# Patient Record
Sex: Male | Born: 1955 | Race: White | Hispanic: No | Marital: Married | Smoking: Former smoker
Health system: Southern US, Academic
[De-identification: ages and names within clinical notes are randomized; demographics above are authoritative.]

## PROBLEM LIST (undated history)

## (undated) DIAGNOSIS — G600 Hereditary motor and sensory neuropathy: Secondary | ICD-10-CM

## (undated) DIAGNOSIS — R208 Other disturbances of skin sensation: Secondary | ICD-10-CM

## (undated) DIAGNOSIS — M2550 Pain in unspecified joint: Secondary | ICD-10-CM

## (undated) DIAGNOSIS — N200 Calculus of kidney: Secondary | ICD-10-CM

## (undated) DIAGNOSIS — N5202 Corporo-venous occlusive erectile dysfunction: Secondary | ICD-10-CM

## (undated) DIAGNOSIS — M545 Low back pain, unspecified: Secondary | ICD-10-CM

## (undated) DIAGNOSIS — N401 Enlarged prostate with lower urinary tract symptoms: Secondary | ICD-10-CM

## (undated) DIAGNOSIS — Z9989 Dependence on other enabling machines and devices: Secondary | ICD-10-CM

## (undated) DIAGNOSIS — G4733 Obstructive sleep apnea (adult) (pediatric): Secondary | ICD-10-CM

## (undated) DIAGNOSIS — G8929 Other chronic pain: Secondary | ICD-10-CM

## (undated) DIAGNOSIS — G890 Central pain syndrome: Secondary | ICD-10-CM

## (undated) DIAGNOSIS — N319 Neuromuscular dysfunction of bladder, unspecified: Secondary | ICD-10-CM

## (undated) DIAGNOSIS — N529 Male erectile dysfunction, unspecified: Secondary | ICD-10-CM

## (undated) DIAGNOSIS — R739 Hyperglycemia, unspecified: Secondary | ICD-10-CM

## (undated) HISTORY — DX: Benign prostatic hyperplasia with lower urinary tract symptoms: N40.1

## (undated) HISTORY — DX: Obstructive sleep apnea (adult) (pediatric): G47.33

## (undated) HISTORY — DX: Central pain syndrome: G89.0

## (undated) HISTORY — DX: Low back pain, unspecified: M54.50

## (undated) HISTORY — DX: Other disturbances of skin sensation: R20.8

## (undated) HISTORY — DX: Other chronic pain: G89.29

## (undated) HISTORY — DX: Dependence on other enabling machines and devices: Z99.89

## (undated) HISTORY — DX: Calculus of kidney: N20.0

## (undated) HISTORY — DX: Corporo-venous occlusive erectile dysfunction: N52.02

## (undated) HISTORY — DX: Male erectile dysfunction, unspecified: N52.9

## (undated) HISTORY — DX: Hereditary motor and sensory neuropathy: G60.0

## (undated) HISTORY — DX: Pain in unspecified joint: M25.50

## (undated) HISTORY — DX: Hyperglycemia, unspecified: R73.9

## (undated) HISTORY — PX: EXCISIONAL HEMORRHOIDECTOMY: SHX1541

## (undated) HISTORY — PX: BLADDER STONE REMOVAL: SHX568

## (undated) HISTORY — DX: Neuromuscular dysfunction of bladder, unspecified: N31.9

---

## 1992-11-08 ENCOUNTER — Emergency Department (HOSPITAL_COMMUNITY): Payer: Self-pay

## 2020-05-14 IMAGING — MR MRI BRAIN WITHOUT CONTRAST
8 of 9 series · 33 of 48 positions shown · IV contrast (gadolinium)
Comparison: Brain MRI dated 10/06/2013.

﻿EXAM:  MRI BRAIN WITHOUT CONTRAST
INDICATION: History of multiple sclerosis, followup exam.
TECHNIQUE: Multiplanar, multisequential MRI of the brain was performed without gadolinium contrast.

[Series 5: DWI · axial · 5.0mm · 1.35mm/px · z∈[-53,+73]mm · 8 of 88 slices shown (1 of 3)]
[im 1/88]
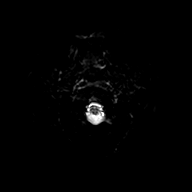
[im 14/88]
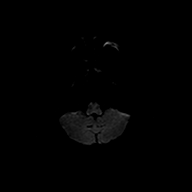
[im 27/88]
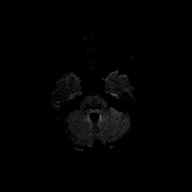
[im 41/88]
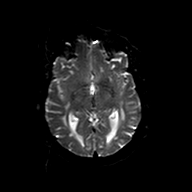
[im 47/88]
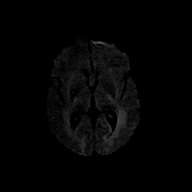
[im 61/88]
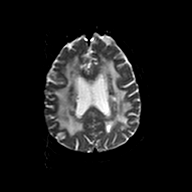
[im 74/88]
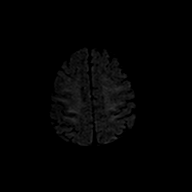
[im 88/88]
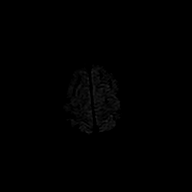

[Series 6: DWI · axial · 5.0mm · 1.35mm/px · z∈[-53,+73]mm · 3 of 22 slices shown (2 of 3)]
[im 1/22]
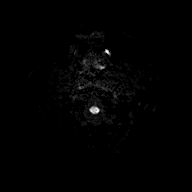
[im 11/22]
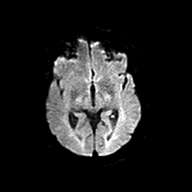
[im 22/22]
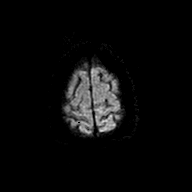

[Series 7: DWI · axial · 5.0mm · 1.35mm/px · z∈[-53,+73]mm · 3 of 22 slices shown (3 of 3)]
[im 1/22]
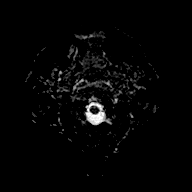
[im 11/22]
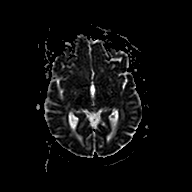
[im 22/22]
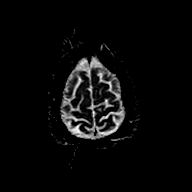

[Series 8: FLAIR · sagittal · 5.0mm · 0.75mm/px · 4 of 28 slices shown (1 of 2)]
[im 1/28]
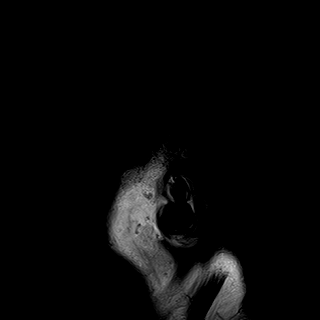
[im 10/28]
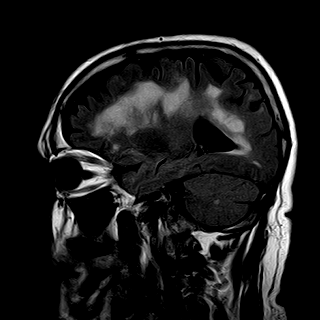
[im 19/28]
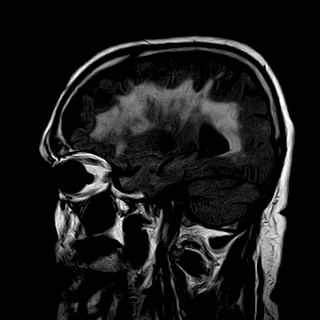
[im 28/28]
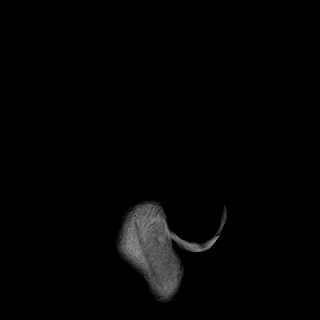

[Series 9: FLAIR · axial · 4.0mm · 0.43mm/px · z∈[-57,+97]mm · 5 of 36 slices shown (2 of 2)]
[im 1/36]
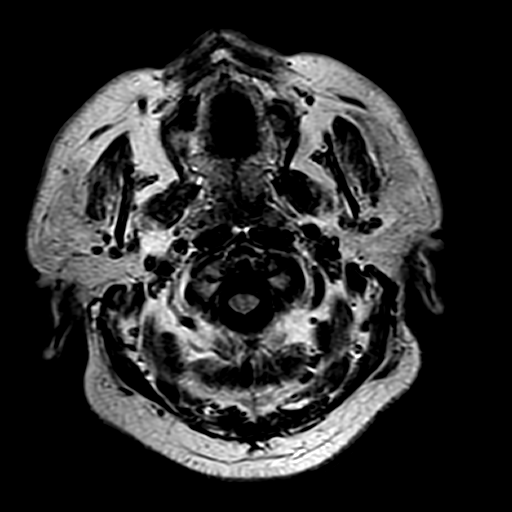
[im 9/36]
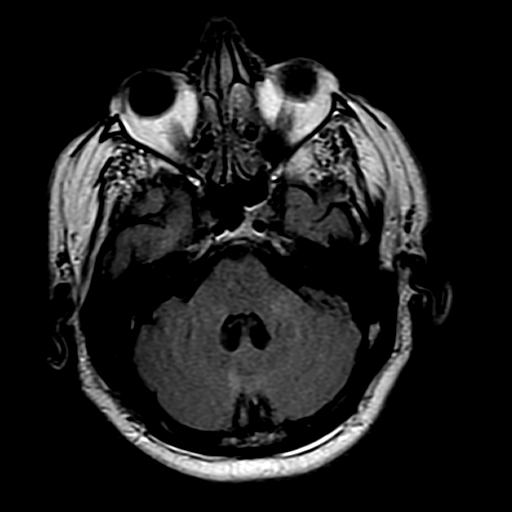
[im 18/36]
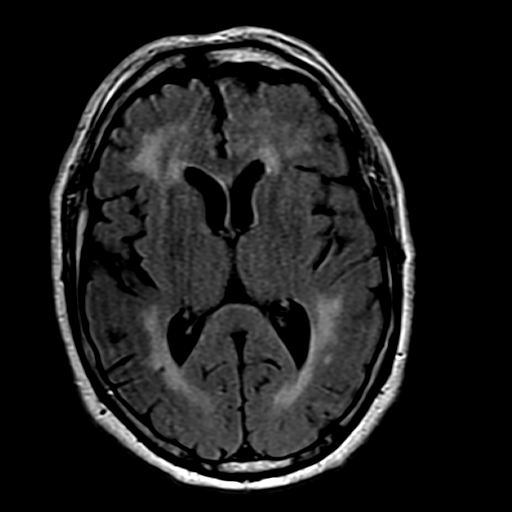
[im 27/36]
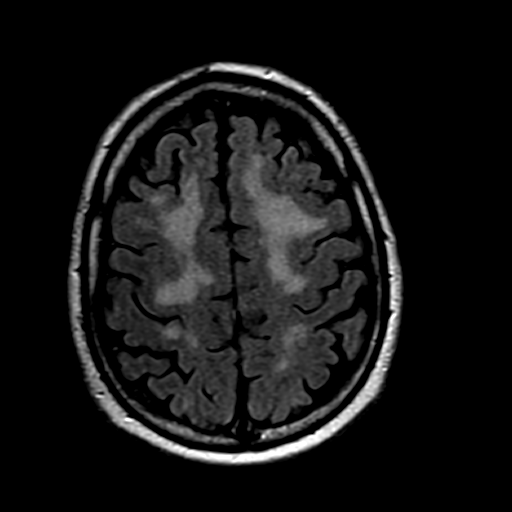
[im 36/36]
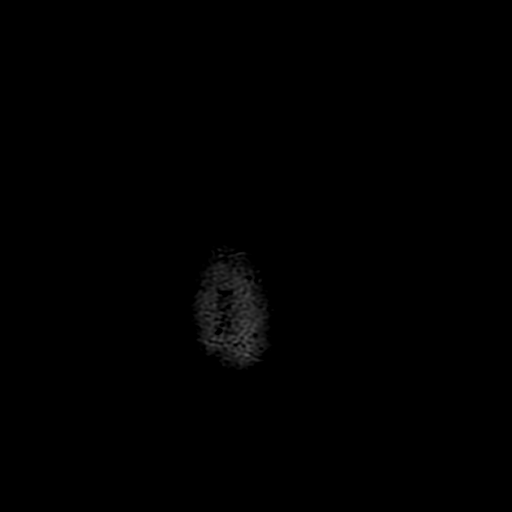

[Series 11: PD · axial · 4.0mm · 0.43mm/px · 1 of 36 slices shown]
[im 1/36]
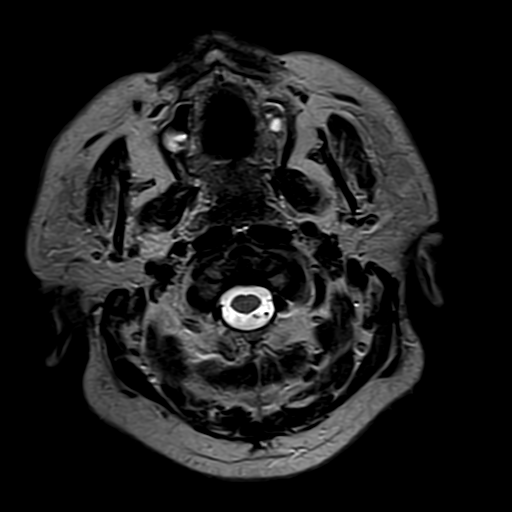

[Series 13: T2 · coronal · 6.0mm · 0.43mm/px · 4 of 28 slices shown]
[im 1/28]
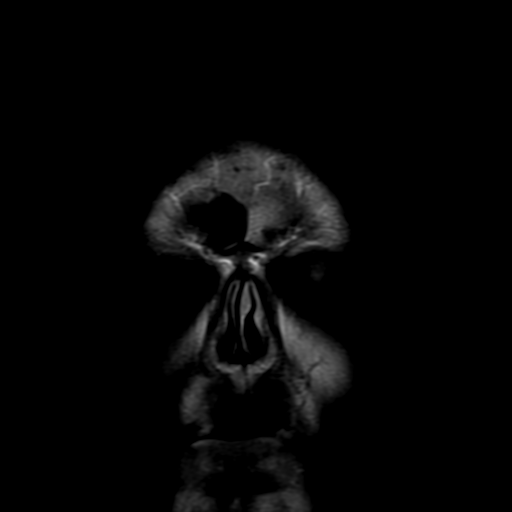
[im 10/28]
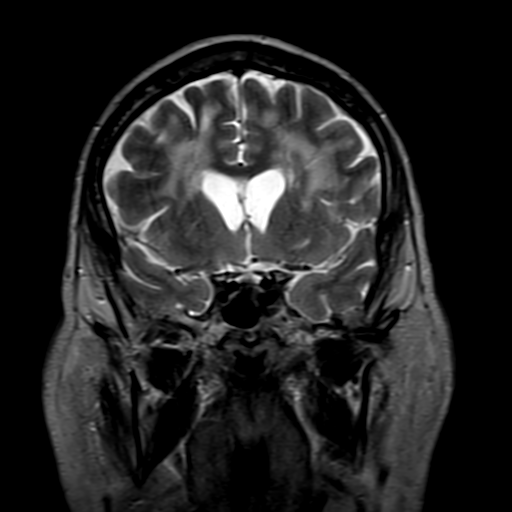
[im 19/28]
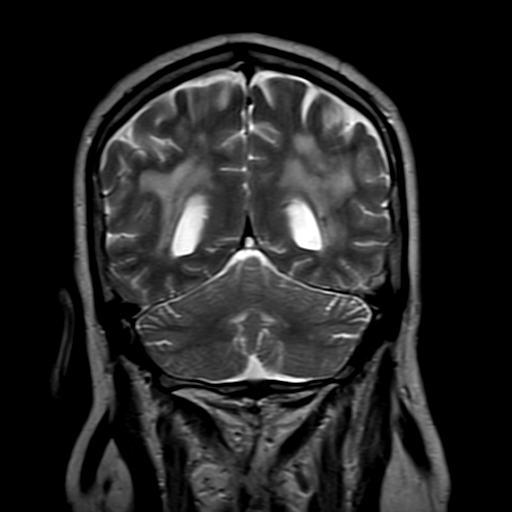
[im 28/28]
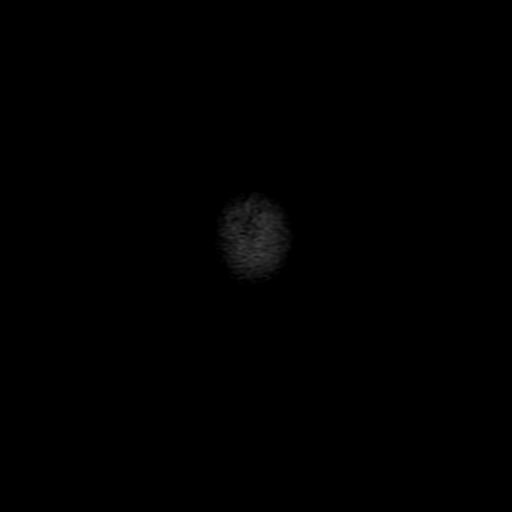

[Series 14: T1 · axial · 4.0mm · 0.43mm/px · z∈[-57,+97]mm · 5 of 36 slices shown]
[im 1/36]
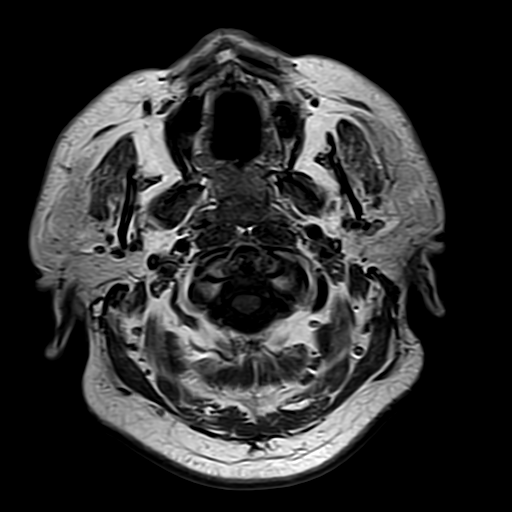
[im 9/36]
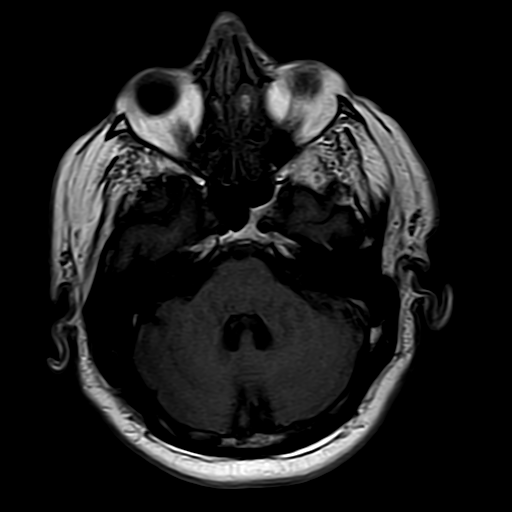
[im 18/36]
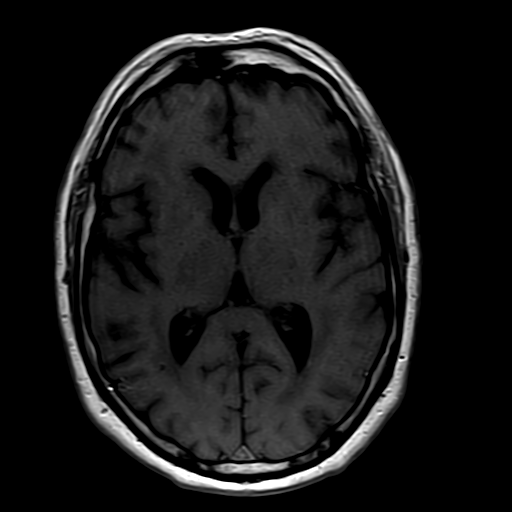
[im 27/36]
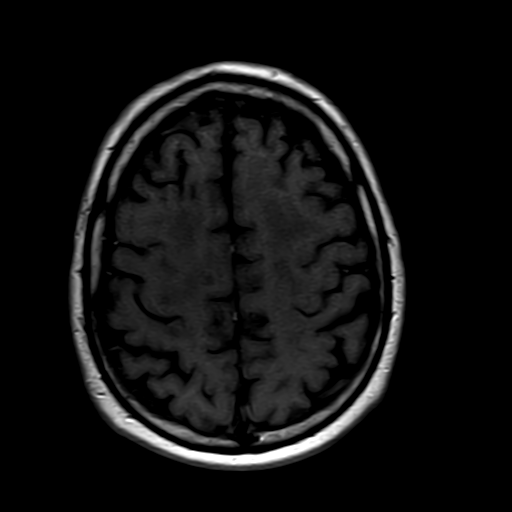
[im 36/36]
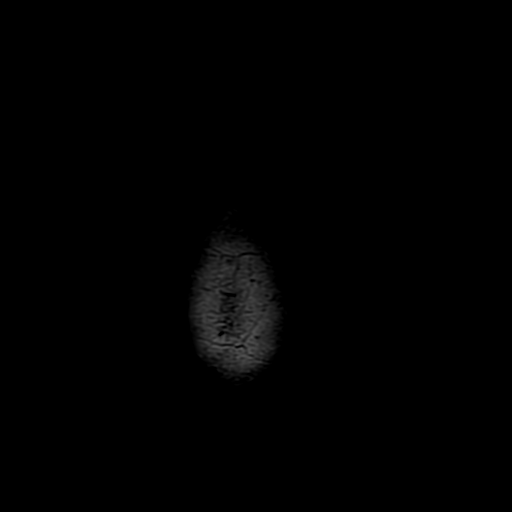

[33 of 48 positions shown; findings below may reference images not displayed]

FINDINGS: Ventricular and sulcal size is normal for the patient's age.  Extensive predominantly confluent foci of abnormal T2/FLAIR signal hyperintensity within both cerebral hemispheres are again identified.  Similar lesions within the cerebellar vermis and left cerebellar hemisphere are also again identified.  No new lesions are noted.  There is no mass effect, midline shift, intracranial hemorrhage, acute infarction or prior microhemorrhages. Skull base flow voids and basal cisterns are patent. Sagittal survey of midline structures is unremarkable. There are no extra-axial fluid collections. Visualized paranasal sinuses cells and orbital contents are unremarkable.  There is extensive opacification of right mastoid air cells.
IMPRESSION: Stable disease.  No acute intracranial abnormality.

## 2021-04-08 DIAGNOSIS — E079 Disorder of thyroid, unspecified: Secondary | ICD-10-CM | POA: Insufficient documentation

## 2021-09-05 ENCOUNTER — Ambulatory Visit (INDEPENDENT_AMBULATORY_CARE_PROVIDER_SITE_OTHER): Payer: Medicare Other

## 2021-09-05 ENCOUNTER — Other Ambulatory Visit: Payer: Medicare Other | Attending: Family Medicine | Admitting: Family Medicine

## 2021-09-05 ENCOUNTER — Other Ambulatory Visit: Payer: Self-pay

## 2021-09-05 DIAGNOSIS — E785 Hyperlipidemia, unspecified: Secondary | ICD-10-CM

## 2021-09-05 DIAGNOSIS — E782 Mixed hyperlipidemia: Secondary | ICD-10-CM | POA: Insufficient documentation

## 2021-09-05 DIAGNOSIS — I10 Essential (primary) hypertension: Secondary | ICD-10-CM

## 2021-09-05 LAB — HEPATIC FUNCTION PANEL
ALBUMIN/GLOBULIN RATIO: 1.3 (ref 0.8–1.4)
ALBUMIN: 4 g/dL (ref 3.5–5.7)
ALKALINE PHOSPHATASE: 40 U/L (ref 34–104)
ALT (SGPT): 16 U/L (ref 7–52)
AST (SGOT): 16 U/L (ref 13–39)
BILIRUBIN DIRECT: 0.04 md/dL (ref <=0.20)
BILIRUBIN TOTAL: 0.4 mg/dL (ref 0.3–1.2)
BILIRUBIN, INDIRECT: 0.36 mg/dL (ref <=1)
GLOBULIN: 3.1 (ref 2.9–5.4)
PROTEIN TOTAL: 7.1 g/dL (ref 6.4–8.9)

## 2021-09-05 LAB — HGA1C (HEMOGLOBIN A1C WITH EST AVG GLUCOSE): HEMOGLOBIN A1C: 5.8 % (ref 4.0–6.0)

## 2021-09-05 LAB — BASIC METABOLIC PANEL
ANION GAP: 7 mmol/L — ABNORMAL LOW (ref 10–20)
BUN/CREA RATIO: 26 — ABNORMAL HIGH (ref 6–22)
BUN: 28 mg/dL — ABNORMAL HIGH (ref 7–25)
CALCIUM: 9.2 mg/dL (ref 8.6–10.3)
CHLORIDE: 110 mmol/L — ABNORMAL HIGH (ref 98–107)
CO2 TOTAL: 23 mmol/L (ref 21–31)
CREATININE: 1.07 mg/dL (ref 0.60–1.30)
ESTIMATED GFR: 76 mL/min/{1.73_m2} (ref >59)
GLUCOSE: 117 mg/dL — ABNORMAL HIGH (ref 74–109)
OSMOLALITY, CALCULATED: 286 mOsm/kg (ref 270–290)
POTASSIUM: 4 mmol/L (ref 3.5–5.1)
SODIUM: 140 mmol/L (ref 136–145)

## 2021-09-05 LAB — CBC
HCT: 42.7 % (ref 42.0–51.0)
HGB: 14.2 g/dL (ref 13.5–18.0)
MCH: 27.6 pg (ref 27.0–32.0)
MCHC: 33.4 g/dL (ref 32.0–36.0)
MCV: 82.8 fL (ref 78.0–99.0)
MPV: 9.5 fL (ref 7.4–10.4)
PLATELETS: 168 10*3/uL (ref 140–440)
RBC: 5.15 10*6/uL (ref 4.20–6.00)
RDW: 15.3 % — ABNORMAL HIGH (ref 11.6–14.8)
WBC: 5.7 10*3/uL (ref 4.0–10.5)
WBCS UNCORRECTED: 5.7 10*3/uL

## 2021-09-05 LAB — LIPID PANEL
CHOL/HDL RATIO: 8.3
CHOLESTEROL: 150 mg/dL (ref <200)
HDL CHOL: 18 mg/dL — ABNORMAL LOW (ref 23–92)
LDL CALC: 80 mg/dL (ref 0–100)
TRIGLYCERIDES: 259 mg/dL — ABNORMAL HIGH (ref <=150)
VLDL CALC: 52 mg/dL — ABNORMAL HIGH (ref 0–50)

## 2021-09-05 LAB — THYROID STIMULATING HORMONE (SENSITIVE TSH): TSH: 3.374 u[IU]/mL (ref 0.450–5.330)

## 2021-09-09 ENCOUNTER — Encounter (INDEPENDENT_AMBULATORY_CARE_PROVIDER_SITE_OTHER): Payer: Self-pay | Admitting: Family Medicine

## 2021-09-09 DIAGNOSIS — G35 Multiple sclerosis: Secondary | ICD-10-CM | POA: Insufficient documentation

## 2021-09-09 DIAGNOSIS — G47419 Narcolepsy without cataplexy: Secondary | ICD-10-CM | POA: Insufficient documentation

## 2021-09-09 DIAGNOSIS — I10 Essential (primary) hypertension: Secondary | ICD-10-CM | POA: Insufficient documentation

## 2021-09-09 DIAGNOSIS — E782 Mixed hyperlipidemia: Secondary | ICD-10-CM

## 2021-09-09 DIAGNOSIS — N400 Enlarged prostate without lower urinary tract symptoms: Secondary | ICD-10-CM | POA: Insufficient documentation

## 2021-09-09 DIAGNOSIS — E559 Vitamin D deficiency, unspecified: Secondary | ICD-10-CM | POA: Insufficient documentation

## 2021-09-14 ENCOUNTER — Ambulatory Visit (INDEPENDENT_AMBULATORY_CARE_PROVIDER_SITE_OTHER): Payer: Medicare Other | Admitting: Family Medicine

## 2021-09-14 ENCOUNTER — Encounter (INDEPENDENT_AMBULATORY_CARE_PROVIDER_SITE_OTHER): Payer: Self-pay | Admitting: Family Medicine

## 2021-09-14 ENCOUNTER — Other Ambulatory Visit: Payer: Self-pay

## 2021-09-14 VITALS — BP 151/86 | HR 70 | Temp 98.9°F | Ht 69.0 in | Wt 237.0 lb

## 2021-09-14 DIAGNOSIS — R351 Nocturia: Secondary | ICD-10-CM

## 2021-09-14 DIAGNOSIS — G500 Trigeminal neuralgia: Secondary | ICD-10-CM | POA: Insufficient documentation

## 2021-09-14 DIAGNOSIS — E782 Mixed hyperlipidemia: Secondary | ICD-10-CM

## 2021-09-14 DIAGNOSIS — G35 Multiple sclerosis: Secondary | ICD-10-CM

## 2021-09-14 DIAGNOSIS — Z9989 Dependence on other enabling machines and devices: Secondary | ICD-10-CM

## 2021-09-14 DIAGNOSIS — N401 Enlarged prostate with lower urinary tract symptoms: Secondary | ICD-10-CM

## 2021-09-14 DIAGNOSIS — E559 Vitamin D deficiency, unspecified: Secondary | ICD-10-CM

## 2021-09-14 DIAGNOSIS — G4733 Obstructive sleep apnea (adult) (pediatric): Secondary | ICD-10-CM

## 2021-09-14 DIAGNOSIS — M5386 Other specified dorsopathies, lumbar region: Secondary | ICD-10-CM

## 2021-09-14 DIAGNOSIS — I10 Essential (primary) hypertension: Secondary | ICD-10-CM

## 2021-09-14 DIAGNOSIS — G47419 Narcolepsy without cataplexy: Secondary | ICD-10-CM

## 2021-09-14 MED ORDER — METHYLPREDNISOLONE ACETATE 40 MG/ML SUSPENSION FOR INJECTION
40.0000 mg | INTRAMUSCULAR | Status: AC
Start: 2021-09-14 — End: 2021-09-14
  Administered 2021-09-14: 40 mg via INTRAMUSCULAR

## 2021-09-14 MED ORDER — LEVOTHYROXINE 25 MCG TABLET
25.0000 ug | ORAL_TABLET | Freq: Every day | ORAL | 1 refills | Status: DC
Start: 2021-09-14 — End: 2022-04-24

## 2021-09-14 MED ORDER — FENOFIBRATE MICRONIZED 200 MG CAPSULE
200.0000 mg | ORAL_CAPSULE | Freq: Every day | ORAL | 1 refills | Status: DC
Start: 2021-09-14 — End: 2022-05-11

## 2021-09-14 MED ORDER — DEXAMETHASONE SODIUM PHOSPHATE 4 MG/ML INJECTION SOLUTION
4.0000 mg | INTRAMUSCULAR | Status: AC
Start: 2021-09-14 — End: 2021-09-14
  Administered 2021-09-14: 4 mg via INTRAMUSCULAR

## 2021-09-14 MED ORDER — METHYLPREDNISOLONE 4 MG TABLETS IN A DOSE PACK
ORAL_TABLET | ORAL | 1 refills | Status: DC
Start: 2021-09-14 — End: 2021-12-12

## 2021-09-14 MED ORDER — DICLOFENAC 1 % TOPICAL GEL
2.00 g | Freq: Four times a day (QID) | CUTANEOUS | 1 refills | Status: AC
Start: 2021-09-14 — End: ?

## 2021-09-14 MED ORDER — TAMSULOSIN 0.4 MG CAPSULE
0.4000 mg | ORAL_CAPSULE | Freq: Every day | ORAL | 1 refills | Status: DC
Start: 2021-09-14 — End: 2022-04-24

## 2021-09-14 NOTE — Nursing Note (Signed)
09/14/21 5790   Depression Screen   Little interest or pleasure in doing things. 0   Feeling down, depressed, or hopeless 0   PHQ 2 Total 0

## 2021-09-14 NOTE — Addendum Note (Signed)
Addended by: Leory Plowman on: 09/14/2021 05:18 PM     Modules accepted: Orders

## 2021-09-14 NOTE — Progress Notes (Signed)
FAMILY MEDICINE, MEDICAL OFFICE BUILDING  [address]  [place] [address]       Name: Carlos Friedman MRN:  [id number]   Date: 09/14/2021 Age: 66 y.o.          Provider: Mickey Farber, DO    Reason for visit: Follow Up 6 Months      History of Present Illness:  09/14/2021:  This 66 year old male returns for six-month follow-up reviewed his labs and get refills on all his medications.  He has lost 6 lb since her last he sees Dr. Farris Has who was late with his multiple sclerosis medications for 3 weeks and is unsure whether the back pain started because he was out of his medications or not.  He has a shooting stabbing tingling pain goes down the back of both legs with mostly on the right side.  He states we have treated this in the past with steroids that helped.  His CBC was normal with a hemoglobin of 14.2 hematocrit 42.7 electrolytes revealed BUN is elevated 2800 make sure he stays well hydrated at all times.  His sodium was 140 chloride 110 carbon dioxide 23 glucose was 117 creatinine 1.07 GFR 76 calcium 9.2 cholesterol was 150 HDL low 18 LDL 80 triglycerides 259 high.  TSH was 3.374 liver enzymes are normal hemoglobin A1c was 5.8.  The patient needs a new mask he continues to use and benefit from CPAP on a regular basis.  Historical Data    Past Medical History:  Past Medical History:   Diagnosis Date   . Arthralgia    . Benign prostatic hyperplasia with lower urinary tract symptoms    . Calculus of kidney    . Chronic bilateral low back pain without sciatica    . Corporo-venous occlusive erectile dysfunction    . Dejerine Roussy syndrome    . Hyperalgesia    . Hyperglycemia    . Kidney stones    . Male erectile dysfunction, unspecified    . Neuromuscular dysfunction of bladder, unspecified    . OSA on CPAP    . Roussy-Levy syndrome          Past Surgical History:        Allergies:  Allergies   Allergen Reactions   . Penicillin Nausea/ Vomiting     Medications:  Current Outpatient Medications    Medication Sig   . cholecalciferol, vitamin D3, 50 mcg (2,000 unit) Oral Tablet Take 1 Tablet (2,000 Units total) by mouth Once a day   . diclofenac sodium (VOLTAREN) 1 % Gel Apply 2 g topically Four times a day   . fenofibrate micronized (LOFIBRA) 200 mg Oral Capsule Take 1 Capsule (200 mg total) by mouth Once a day   . glatiramer 20 mg/mL Subcutaneous Syringe Inject 1 mL (20 mg total) under the skin Once a day   . levothyroxine (SYNTHROID) 25 mcg Oral Tablet Take 1 Tablet (25 mcg total) by mouth Once a day   . Methylprednisolone (MEDROL, PAK,) 4 mg Oral Tablets, Dose Pack Take as instructed.   . tamsulosin (FLOMAX) 0.4 mg Oral Capsule Take 1 Capsule (0.4 mg total) by mouth Once a day     Family History:  Family Medical History:     Problem Relation (Age of Onset)    COPD Other    Hypertension (High Blood Pressure) Other          Social History:  Social History     Socioeconomic History   . Marital status:  Married   Tobacco Use   . Smoking status: Former     Packs/day: 1.00     Years: 20.00     Pack years: 20.00     Types: Cigarettes     Quit date: 2005     Years since quitting: 18.5   . Smokeless tobacco: Never   Vaping Use   . Vaping Use: Never used   Substance and Sexual Activity   . Alcohol use: Yes     Comment: socially   . Drug use: Never           Review of Systems:  Any pertinent Review of Systems as addressed in the HPI above.    Physical Exam:  Vital Signs:  Vitals:    09/14/21 0929   BP: (!) 151/86   Pulse: 70   Temp: 37.2 C (98.9 F)   TempSrc: Temporal   SpO2: 94%   Weight: 108 kg (237 lb)   Height: 1.753 m (5\' 9" )   BMI: 35.07   HC: 18 cm (7.09")     Physical Exam  Constitutional:       Appearance: Normal appearance. He is obese.      Comments: BMI is elevated at 35.00 kilograms/meter squared which is class 2 obesity.  His heart.   HENT:      Head: Normocephalic and atraumatic.      Right Ear: Tympanic membrane, ear canal and external ear normal.      Left Ear: Tympanic membrane, ear canal and  external ear normal.      Nose: Nose normal.      Mouth/Throat:      Mouth: Mucous membranes are moist.      Pharynx: Oropharynx is clear.   Eyes:      Extraocular Movements: Extraocular movements intact.      Conjunctiva/sclera: Conjunctivae normal.      Pupils: Pupils are equal, round, and reactive to light.   Cardiovascular:      Rate and Rhythm: Normal rate and regular rhythm.      Pulses: Normal pulses.      Heart sounds: Normal heart sounds.   Pulmonary:      Effort: Pulmonary effort is normal.      Breath sounds: Normal breath sounds.   Abdominal:      General: Abdomen is flat.      Palpations: Abdomen is soft.   Musculoskeletal:      Cervical back: Normal range of motion and neck supple.      Lumbar back: Spasms and tenderness present. Decreased range of motion.      Comments: Patient was able to flex to touch his toes with complete reversal of the lumbar curve.  He had a negative dural stretch sign and normal reflexes.   Skin:     General: Skin is warm and dry.      Capillary Refill: Capillary refill takes less than 2 seconds.   Neurological:      General: No focal deficit present.      Mental Status: He is alert and oriented to person, place, and time. Mental status is at baseline.   Psychiatric:         Mood and Affect: Mood normal.         Behavior: Behavior normal.       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 Multiple sclerosis (CMS HCC)  G35  3. Vitamin D deficiency  E55.9       4. Essential (primary) hypertension  I10 LIPID PANEL     HEPATIC FUNCTION PANEL     CBC/DIFF     URINALYSIS, MACROSCOPIC AND MICROSCOPIC W/CULTURE REFLEX     BASIC METABOLIC PANEL     THYROID STIMULATING HORMONE (SENSITIVE TSH)      5. Mixed hyperlipidemia  E78.2       6. Benign prostatic hyperplasia with nocturia  N40.1     R35.1       7. Sciatica associated with disorder of lumbar spine  M53.86       8. OSA on CPAP  G47.33     Z99.89          Plan:  Orders Placed This  Encounter   . LIPID PANEL   . HEPATIC FUNCTION PANEL   . CBC/DIFF   . URINALYSIS, MACROSCOPIC AND MICROSCOPIC W/CULTURE REFLEX   . BASIC METABOLIC PANEL   . THYROID STIMULATING HORMONE (SENSITIVE TSH)   . diclofenac sodium (VOLTAREN) 1 % Gel   . fenofibrate micronized (LOFIBRA) 200 mg Oral Capsule   . levothyroxine (SYNTHROID) 25 mcg Oral Tablet   . tamsulosin (FLOMAX) 0.4 mg Oral Capsule   . Methylprednisolone (MEDROL, PAK,) 4 mg Oral Tablets, Dose Pack   . methylPREDNISolone acetate (DEPO-MEDROL) 40 mg/mL injection   . dexamethasone 4 mg/mL injection     The patient continues to use and benefit from CPAP.  Today I refilled his diclofenac gel fenofibrate levothyroxine tamsulosin methylprednisolone Medrol Dosepak was given I will also give him 4 mg of Decadron and 40 mg of Depo-Medrol.  Today I ordered a lipid panel hepatic function panel CBC with diff urinalysis basic metabolic panel thyroid-stimulating hormone to be done in 6 months.  More than 50% of visit spent counseling and coordinating patient care.  All questions were answered to his satisfaction of the patient.  I continue to recommend he stay on a heart healthy low-fat low-cholesterol diet and avoid high fructose corn syrup.  When we have a new neurologist here he will consider switching neurologist.  His blood pressure was elevated today and I suggested he limit salt to no more than 2 g a day.  Also recommended check his blood pressure frequently at home if it stays above 120/80 he may need to be on medications to control his blood pressure.  His narcolepsy with cataplexy is stable his multiple sclerosis is stable.  I also put an order for an x-ray of his lumbar spine.    Return in about 6 months (around 03/17/2022).    Mickey Farber, DO     Portions of this note may be dictated using voice recognition software or a dictation service. Variances in spelling and vocabulary are possible and unintentional. Not all errors are caught/corrected. Please notify the  [facility] if any discrepancies are noted or if the meaning of any statement is not clear.

## 2021-10-07 IMAGING — MR MRI BRAIN W/O CONTRAST
8 of 10 series · 32 of 48 positions shown · IV contrast (gadolinium)
Comparison: Previous examinations dated 05/14/2020, 10/06/2013.

﻿EXAM:  MRI BRAIN W/O CONTRAST
INDICATION: Multiple sclerosis, follow-up.
TECHNIQUE: Multiplanar, multisequential MRI of the brain was performed without gadolinium contrast.  Axial and sagittal FLAIR sequences as per [facility] protocol.

[Series 5: DWI · axial · 5.0mm · 1.35mm/px · z∈[-48,+78]mm · 8 of 88 slices shown (1 of 3)]
[im 1/88]
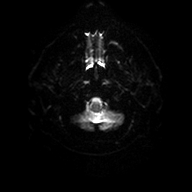
[im 10/88]
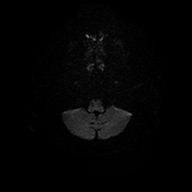
[im 30/88]
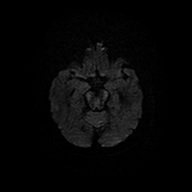
[im 39/88]
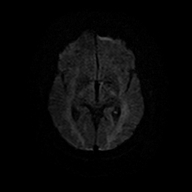
[im 49/88]
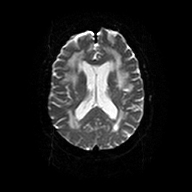
[im 59/88]
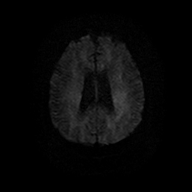
[im 78/88]
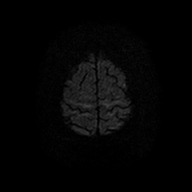
[im 88/88]
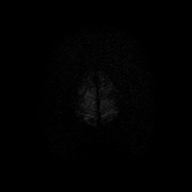

[Series 6: DWI · axial · 5.0mm · 1.35mm/px · z∈[-48,+78]mm · 2 of 22 slices shown (2 of 3)]
[im 1/22]
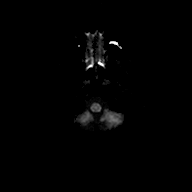
[im 22/22]
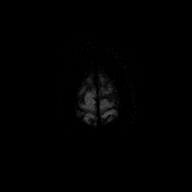

[Series 7: DWI · axial · 5.0mm · 1.35mm/px · z∈[-48,+78]mm · 3 of 22 slices shown (3 of 3)]
[im 1/22]
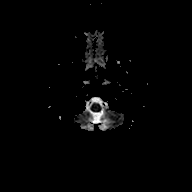
[im 11/22]
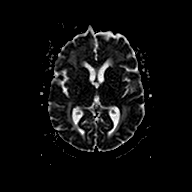
[im 22/22]
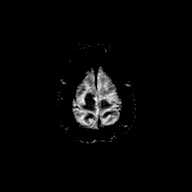

[Series 8: FLAIR · sagittal · 5.0mm · 0.75mm/px · 4 of 28 slices shown (1 of 2)]
[im 1/28]
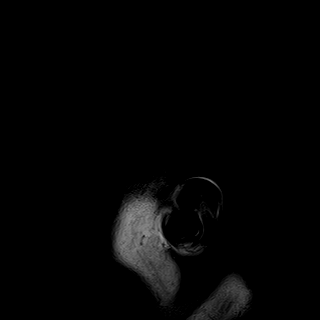
[im 10/28]
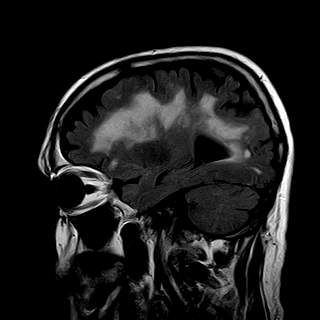
[im 19/28]
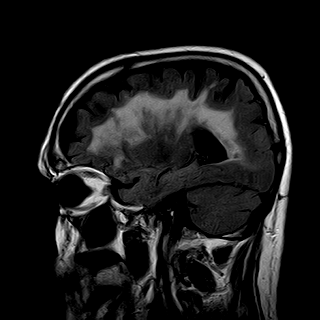
[im 28/28]
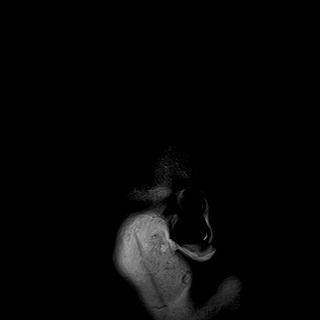

[Series 9: PD · axial · 4.0mm · 0.43mm/px · 1 of 36 slices shown]
[im 1/36]
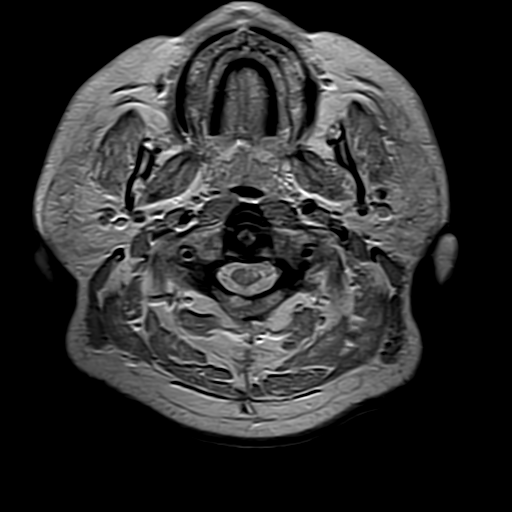

[Series 11: FLAIR · axial · 4.0mm · 0.43mm/px · z∈[-64,+90]mm · 5 of 36 slices shown (2 of 2)]
[im 1/36]
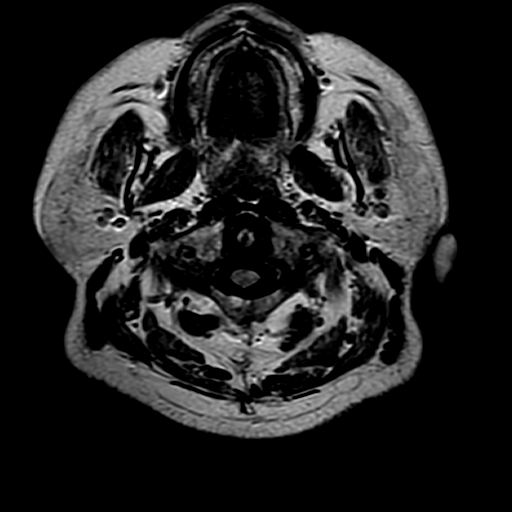
[im 9/36]
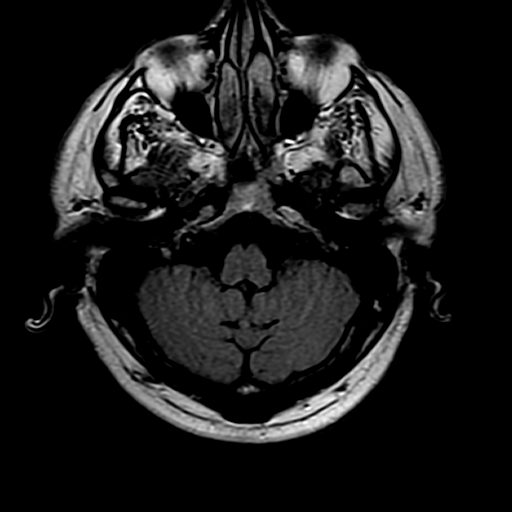
[im 18/36]
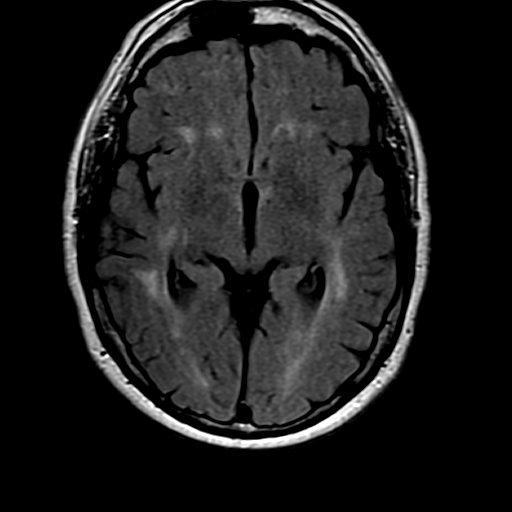
[im 27/36]
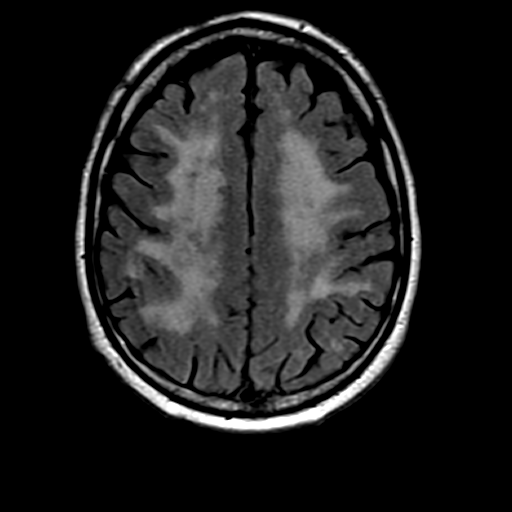
[im 36/36]
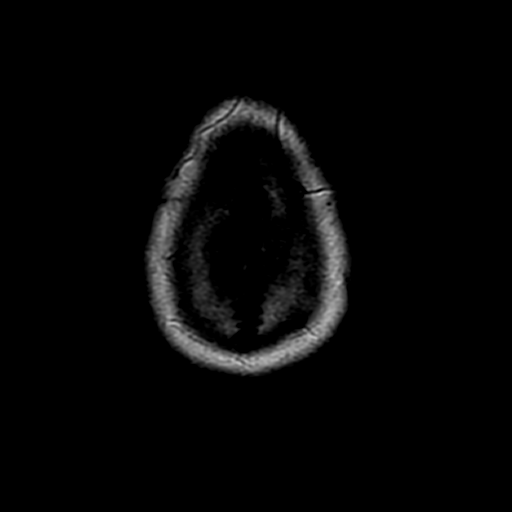

[Series 13: T1 · axial · 4.0mm · 0.43mm/px · z∈[-64,+90]mm · 5 of 36 slices shown]
[im 1/36]
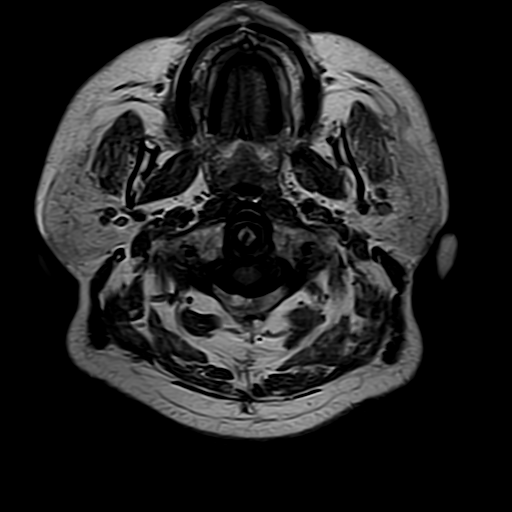
[im 9/36]
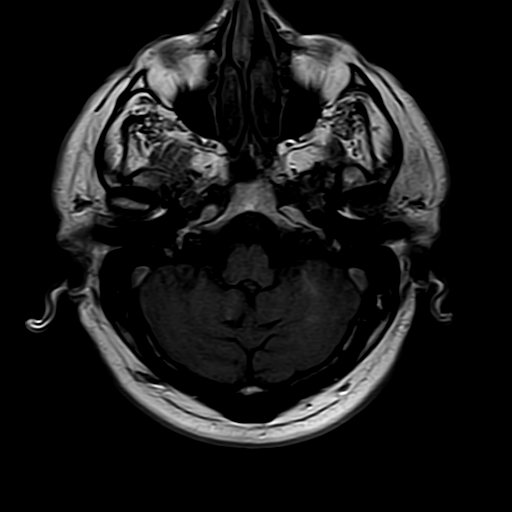
[im 18/36]
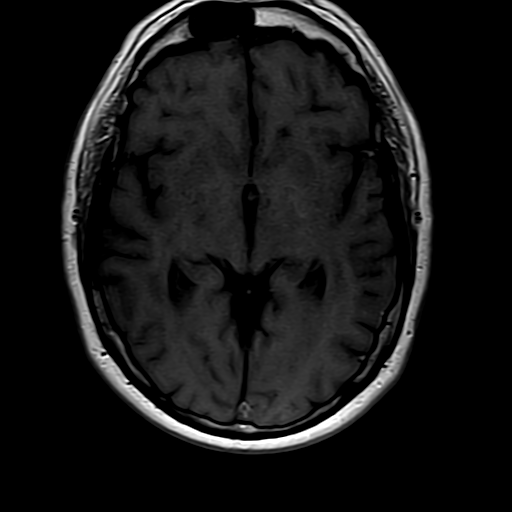
[im 27/36]
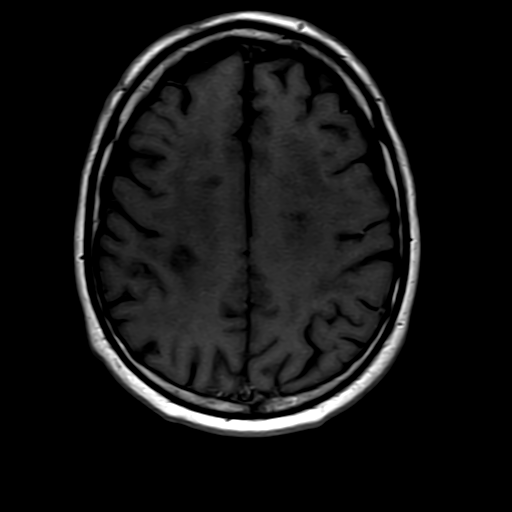
[im 36/36]
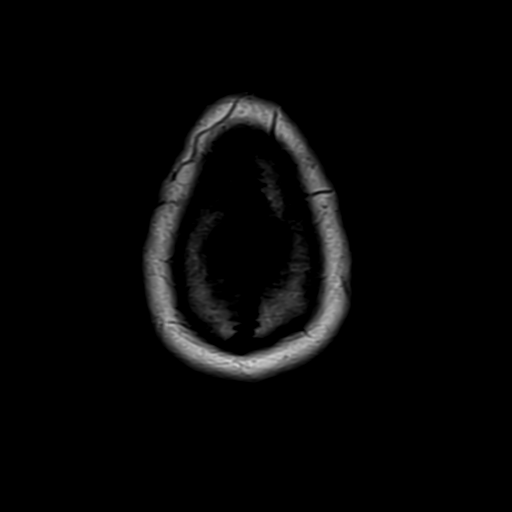

[Series 14: T2 · coronal · 5.0mm · 0.43mm/px · 4 of 28 slices shown]
[im 1/28]
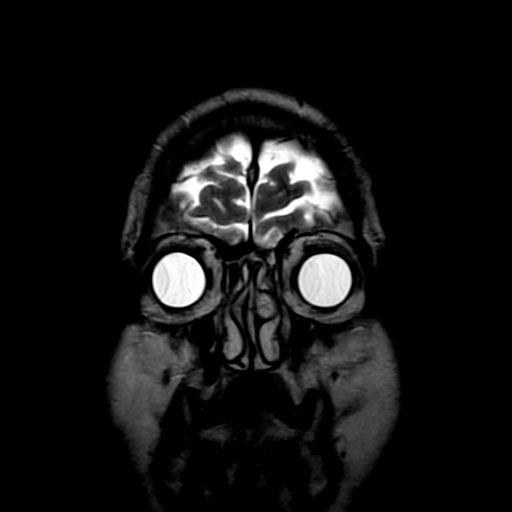
[im 10/28]
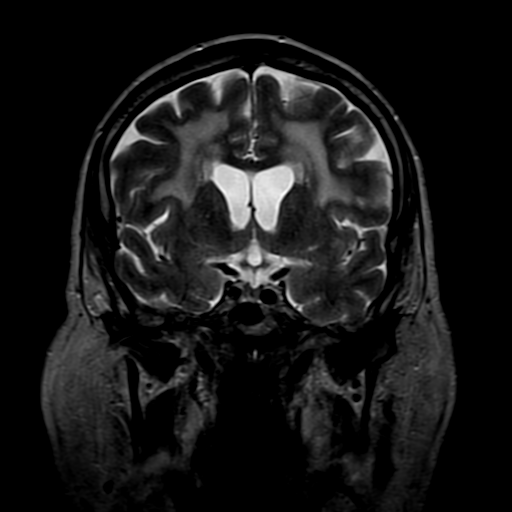
[im 19/28]
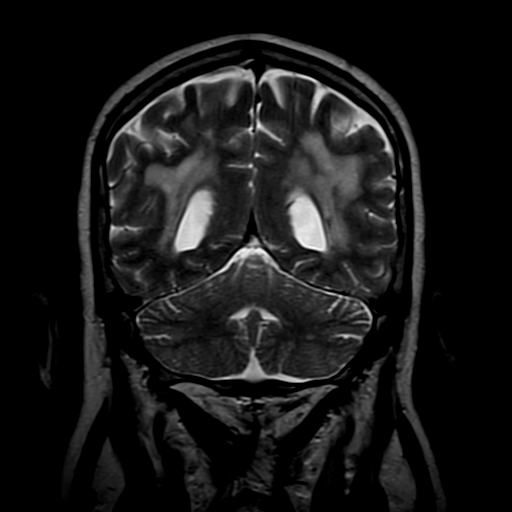
[im 28/28]
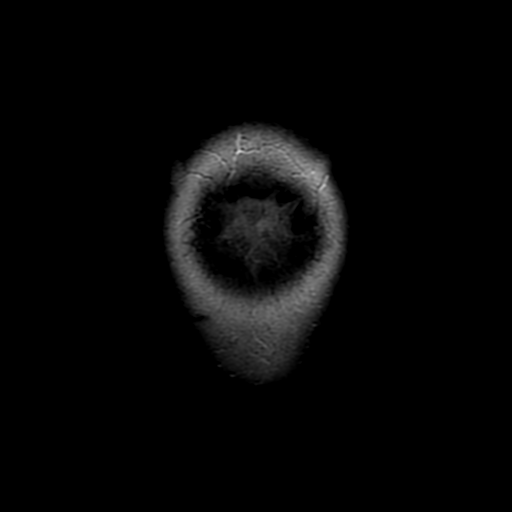

[32 of 48 positions shown; findings below may reference images not displayed]

FINDINGS: No focal areas of restricted diffusion.  No intracranial bleed, extra-axial collections or ventriculomegaly.

Extensive FLAIR signal abnormalities of the periventricular white matter on both sides due to extensive changes of demyelinating disease due to multiple sclerosis are stable in appearance.  Corpus callosum is involved with FLAIR bright lesions. This finding is also unchanged.

Involvement of the superior cerebellum on both sides and the midbrain with FLAIR signal lesions similar to prior study.

Stable, moderate symmetric global cerebral cortical atrophy.  Major arteries of circle of Willis and dural venous sinuses are patent.
IMPRESSION: 1. Extensive demyelinating process involving periventricular white matter on both sides and involving the corpus callosum is stable in appearance from last examination of 05/14/2020.

2. Milder involvement of the upper cerebellum and the midbrain are also stable in appearance.

3. No focal restricted diffusion to suggest active disease in this noncontrast examination.

4. Stable, moderate symmetric cerebral cortical atrophy.

## 2021-12-12 ENCOUNTER — Encounter (INDEPENDENT_AMBULATORY_CARE_PROVIDER_SITE_OTHER): Payer: Self-pay | Admitting: Family Medicine

## 2021-12-12 ENCOUNTER — Other Ambulatory Visit: Payer: Self-pay

## 2021-12-12 ENCOUNTER — Ambulatory Visit (INDEPENDENT_AMBULATORY_CARE_PROVIDER_SITE_OTHER): Payer: Medicare Other | Admitting: Family Medicine

## 2021-12-12 VITALS — BP 138/78 | HR 106 | Temp 99.4°F | Resp 18 | Ht 69.0 in | Wt 235.0 lb

## 2021-12-12 DIAGNOSIS — M5386 Other specified dorsopathies, lumbar region: Secondary | ICD-10-CM

## 2021-12-12 MED ORDER — KETOROLAC 30 MG/ML (1 ML) INJECTION SOLUTION
30.0000 mg | Freq: Once | INTRAMUSCULAR | Status: AC
Start: 2021-12-12 — End: 2021-12-12
  Administered 2021-12-12: 30 mg via INTRAMUSCULAR

## 2021-12-12 MED ORDER — METHYLPREDNISOLONE 4 MG TABLETS IN A DOSE PACK
ORAL_TABLET | ORAL | 1 refills | Status: DC
Start: 2021-12-12 — End: 2022-05-17

## 2021-12-12 MED ORDER — METHYLPREDNISOLONE ACETATE 40 MG/ML SUSPENSION FOR INJECTION
40.0000 mg | INTRAMUSCULAR | Status: AC
Start: 2021-12-12 — End: 2021-12-12
  Administered 2021-12-12: 40 mg via INTRAMUSCULAR

## 2021-12-12 MED ORDER — DEXAMETHASONE SODIUM PHOSPHATE 4 MG/ML INJECTION SOLUTION
4.0000 mg | INTRAMUSCULAR | Status: AC
Start: 2021-12-12 — End: 2021-12-12
  Administered 2021-12-12: 4 mg via INTRAMUSCULAR

## 2021-12-12 MED ORDER — NAPROXEN 500 MG TABLET,DELAYED RELEASE
500.0000 mg | DELAYED_RELEASE_TABLET | Freq: Two times a day (BID) | ORAL | 5 refills | Status: DC
Start: 2021-12-12 — End: 2021-12-14

## 2021-12-12 NOTE — Progress Notes (Signed)
FAMILY MEDICINE, MEDICAL OFFICE BUILDING  [address]  [place] [address]       Name: Carlos Friedman MRN:  [id number]   Date: 12/12/2021 Age: 66 y.o.          Provider: Mickey Farber, DO    Reason for visit: Back Pain and Left Leg Pain      History of Present Illness:  12/12/2021:  This 66 year old male comes to the office because of left leg pain and back pain.  He said this has been going on for a week.  He would had this problem in the past in July and we gave him steroids was cleared up he did not get the x-ray that we recommended.  He has been using Voltaren gel on his joints but he is requesting something for arthritis pain.  He states the pain radiates down the left leg this time.  He has no saddle anesthesia or bowel or bladder dysfunction.  Historical Data    Past Medical History:  Past Medical History:   Diagnosis Date    Arthralgia     Benign prostatic hyperplasia with lower urinary tract symptoms     Calculus of kidney     Chronic bilateral low back pain without sciatica     Corporo-venous occlusive erectile dysfunction     Dejerine Roussy syndrome     Hyperalgesia     Hyperglycemia     Kidney stones     Male erectile dysfunction, unspecified     Neuromuscular dysfunction of bladder, unspecified     OSA on CPAP     Roussy-Levy syndrome          Past Surgical History:  History reviewed. No past surgical history pertinent negatives.      Allergies:  Allergies   Allergen Reactions    Penicillin Nausea/ Vomiting     Medications:  Current Outpatient Medications   Medication Sig    cholecalciferol, vitamin D3, 50 mcg (2,000 unit) Oral Tablet Take 1 Tablet (2,000 Units total) by mouth Once a day    diclofenac sodium (VOLTAREN) 1 % Gel Apply 2 g topically Four times a day    fenofibrate micronized (LOFIBRA) 200 mg Oral Capsule Take 1 Capsule (200 mg total) by mouth Once a day    glatiramer 20 mg/mL Subcutaneous Syringe Inject 1 mL (20 mg total) under the skin Once a day    levothyroxine  (SYNTHROID) 25 mcg Oral Tablet Take 1 Tablet (25 mcg total) by mouth Once a day    Methylprednisolone (MEDROL, PAK,) 4 mg Oral Tablets, Dose Pack Take as instructed.    Naproxen 500 mg Oral Tablet, Delayed Release (E.C.) Take 1 Tablet (500 mg total) by mouth Twice daily    tamsulosin (FLOMAX) 0.4 mg Oral Capsule Take 1 Capsule (0.4 mg total) by mouth Once a day     Family History:  Family Medical History:       Problem Relation (Age of Onset)    COPD Other    Hypertension (High Blood Pressure) Other            Social History:  Social History     Socioeconomic History    Marital status: Married   Tobacco Use    Smoking status: Former     Packs/day: 1.00     Years: 20.00     Additional pack years: 0.00     Total pack years: 20.00     Types: Cigarettes     Quit date: 2005  Years since quitting: 18.8    Smokeless tobacco: Never   Vaping Use    Vaping Use: Never used   Substance and Sexual Activity    Alcohol use: Yes     Comment: socially    Drug use: Never           Review of Systems:  Any pertinent Review of Systems as addressed in the HPI above.    Physical Exam:  Vital Signs:  Vitals:    12/12/21 1612   BP: 138/78   Pulse: (!) 106   Resp: 18   Temp: 37.4 C (99.4 F)   TempSrc: Temporal   SpO2: 96%   Weight: 107 kg (235 lb)   Height: 1.753 m (5\' 9" )   BMI: 34.78     Physical Exam  Constitutional:       General: He is awake.      Appearance: Normal appearance. He is well-developed and well-groomed. He is obese.      Comments: BMI is 34.70 kg per m2 which is class 1 obesity.   HENT:      Head: Normocephalic and atraumatic.      Right Ear: Tympanic membrane, ear canal and external ear normal.      Left Ear: Tympanic membrane, ear canal and external ear normal.      Nose: Nose normal.      Mouth/Throat:      Mouth: Mucous membranes are moist.      Pharynx: Oropharynx is clear.   Eyes:      Extraocular Movements: Extraocular movements intact.      Conjunctiva/sclera: Conjunctivae normal.      Pupils: Pupils are equal,  round, and reactive to light.   Cardiovascular:      Rate and Rhythm: Normal rate and regular rhythm.      Pulses: Normal pulses.      Heart sounds: Normal heart sounds.   Pulmonary:      Effort: Pulmonary effort is normal.      Breath sounds: Normal breath sounds.   Abdominal:      General: Abdomen is flat.      Palpations: Abdomen is soft.   Musculoskeletal:      Cervical back: Normal range of motion and neck supple.      Lumbar back: Spasms and tenderness present. Decreased range of motion. Positive left straight leg raise test.      Comments: Positive dural stretch sign on the left negative on the right   Skin:     General: Skin is warm and dry.      Capillary Refill: Capillary refill takes less than 2 seconds.   Neurological:      General: No focal deficit present.      Mental Status: He is alert and oriented to person, place, and time. Mental status is at baseline.      Deep Tendon Reflexes:      Reflex Scores:       Patellar reflexes are 2+ on the right side and 2+ on the left side.       Achilles reflexes are 2+ on the right side and 1+ on the left side.  Psychiatric:         Mood and Affect: Mood normal.         Behavior: Behavior normal. Behavior is cooperative.       Assessment:    ICD-10-CM    1. Sciatica associated with disorder of lumbar spine  M53.86  Plan:  Orders Placed This Encounter    Methylprednisolone (MEDROL, PAK,) 4 mg Oral Tablets, Dose Pack    methylPREDNISolone acetate (DEPO-medrol) 40 mg/mL injection    dexAMETHasone 4 mg/mL injection    ketorolac (TORADOL) 30 mg/mL injection    Naproxen 500 mg Oral Tablet, Delayed Release (E.C.)     The patient still has an order there for x-ray of his lumbar spine.  Today I am going to give him 30 mg of Toradol 4 mg of dexamethasone 40 mg of Depo-Medrol and a Medrol Dosepak.  Also going to start him on Naprosyn 500 for arthritis pain.  If the pain continues he needs to get an x-ray of his back and MRI of his back.  He does have a history of  multiple sclerosis.  Moist heat would also help with his low back pain.  He will follow-up with [place] if this does not help.    No follow-ups on file.    Elliot Gault, DO     Portions of this note may be dictated using voice recognition software or a dictation service. Variances in spelling and vocabulary are possible and unintentional. Not all errors are caught/corrected. Please notify the [facility] if any discrepancies are noted or if the meaning of any statement is not clear.

## 2021-12-14 ENCOUNTER — Other Ambulatory Visit (INDEPENDENT_AMBULATORY_CARE_PROVIDER_SITE_OTHER): Payer: Self-pay | Admitting: Family Medicine

## 2021-12-14 MED ORDER — NAPROXEN 500 MG TABLET
500.0000 mg | ORAL_TABLET | Freq: Two times a day (BID) | ORAL | 0 refills | Status: DC
Start: 2021-12-14 — End: 2022-05-17

## 2022-03-17 ENCOUNTER — Encounter (INDEPENDENT_AMBULATORY_CARE_PROVIDER_SITE_OTHER): Payer: Self-pay | Admitting: Family Medicine

## 2022-04-21 ENCOUNTER — Ambulatory Visit (INDEPENDENT_AMBULATORY_CARE_PROVIDER_SITE_OTHER): Payer: Medicare Other

## 2022-04-21 ENCOUNTER — Encounter (INDEPENDENT_AMBULATORY_CARE_PROVIDER_SITE_OTHER): Payer: Self-pay

## 2022-04-21 ENCOUNTER — Other Ambulatory Visit: Payer: Self-pay

## 2022-04-21 VITALS — BP 150/68 | HR 93 | Temp 97.6°F | Ht 69.0 in | Wt 250.0 lb

## 2022-04-21 DIAGNOSIS — U071 COVID-19: Secondary | ICD-10-CM

## 2022-04-21 DIAGNOSIS — R0981 Nasal congestion: Secondary | ICD-10-CM

## 2022-04-21 DIAGNOSIS — R52 Pain, unspecified: Secondary | ICD-10-CM

## 2022-04-21 DIAGNOSIS — R5383 Other fatigue: Secondary | ICD-10-CM

## 2022-04-21 LAB — POCT RAPID COVID-19 & FLU (AMB ONLY)
COVID-19 AG: POSITIVE — AB
INFLUENZA TYPE A: NEGATIVE
INFLUENZA TYPE B: NEGATIVE

## 2022-04-21 MED ORDER — PROMETHAZINE-DM 6.25 MG-15 MG/5 ML ORAL SYRUP
5.0000 mL | ORAL_SOLUTION | Freq: Four times a day (QID) | ORAL | 0 refills | Status: DC | PRN
Start: 2022-04-21 — End: 2022-05-17

## 2022-04-21 MED ORDER — NIRMATRELVIR 300 MG (150 MG X2)-RITONAVIR 100 MG TABLET,DOSE PACK
3.0000 | ORAL_TABLET | Freq: Two times a day (BID) | ORAL | 0 refills | Status: DC
Start: 2022-04-21 — End: 2022-05-17

## 2022-04-21 NOTE — Nursing Note (Signed)
04/21/22 0930   Fall Risk Assessment   Do you feel unsteady when standing or walking? No   Do you worry about falling? No   Have you fallen in the past year? No

## 2022-04-21 NOTE — Progress Notes (Cosign Needed Addendum)
FAMILY MEDICINE, MEDICAL OFFICE BUILDING  [place] [address]      Carlos Friedman  10-Aug-1955  [id number]    Date of Service: 04/21/2022  9:15 AM EST    Chief complaint:   Chief Complaint   Patient presents with    Congestion    Generalized Body Aches    Cough    Fatigue     Subjective:     This is a case of a 67 y.o. year old male who comes in today for complaint(s) of: Cough  Patient complains of nasal congestion and productive cough with sputum described as green or tenacious.  Symptoms began 4 days ago.  The cough is productive of green/yellow sputum and is aggravated by infection. Patient has been treating his symptoms with OTC Ibuprofen, tylenol and aleve. Patient states that he has had Covid three times, but that he has not been around anyone sick recently.    ROS:  Review of Systems   Constitutional:  Positive for fatigue.   HENT:  Positive for congestion.    Eyes: Negative.    Respiratory:  Positive for cough.    Cardiovascular: Negative.    Gastrointestinal: Negative.    Endocrine: Negative.    Genitourinary: Negative.    Musculoskeletal: Negative.    Skin: Negative.    Allergic/Immunologic: Negative.    Neurological: Negative.    Hematological: Negative.    Psychiatric/Behavioral: Negative.        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     Resolved Ambulatory Problems     Diagnosis Date Noted    No Resolved Ambulatory Problems     Past Medical History:   Diagnosis Date    Arthralgia     Benign prostatic hyperplasia with lower urinary tract symptoms     Calculus of kidney     Chronic bilateral low back pain without sciatica     Corporo-venous occlusive erectile dysfunction      Dejerine Roussy syndrome     Hyperalgesia     Hyperglycemia     Kidney stones     Male erectile dysfunction, unspecified     Neuromuscular dysfunction of bladder, unspecified     OSA on CPAP     Roussy-Levy syndrome      Current Outpatient Medications   Medication Sig    cholecalciferol, vitamin D3, 50 mcg (2,000 unit) Oral Tablet Take 1 Tablet (2,000 Units total) by mouth Once a day    diclofenac sodium (VOLTAREN) 1 % Gel Apply 2 g topically Four times a day    fenofibrate micronized (LOFIBRA) 200 mg Oral Capsule Take 1 Capsule (200 mg total) by mouth Once a day    gabapentin (NEURONTIN) 300 mg Oral Capsule Take 1 Capsule (300 mg total) by mouth Three times a day    glatiramer 20 mg/mL Subcutaneous Syringe Inject 1 mL (20 mg total) under the skin Once a day    levothyroxine (SYNTHROID) 25 mcg Oral Tablet Take 1 Tablet (25 mcg total) by mouth Once a day    Methylprednisolone (MEDROL, PAK,) 4 mg Oral Tablets, Dose Pack Take as instructed.    naproxen (NAPROSYN) 500  mg Oral Tablet Take 1 Tablet (500 mg total) by mouth Twice daily with food    nirmatrelvir-ritonavir (PAXLOVID) 300 mg (150 mg x 2)-'100mg'$  tablet therapy pack Take 3 Tablets by mouth Twice daily for 5 days    promethazine-dextromethorphan (PHENERGAN-DM) 6.25-15 mg/5 mL Oral Syrup Take 5 mL by mouth Four times a day as needed for Cough for up to 7 days    tamsulosin (FLOMAX) 0.4 mg Oral Capsule Take 1 Capsule (0.4 mg total) by mouth Once a day     Objective   BP (!) 150/68   Pulse 93   Temp 36.4 C (97.6 F) (Temporal)   Ht 1.753 m ('5\' 9"'$ )   Wt 113 kg (250 lb)   SpO2 95%   BMI 36.92 kg/m     BMI addressed: Advised on diet, weight loss, and exercise to reduce above normal BMI.     Physical Exam  Vitals and nursing note reviewed.   Constitutional:       Appearance: Normal appearance. He is normal weight.   HENT:      Nose: Congestion present.      Mouth/Throat:      Mouth: Mucous membranes are moist.      Pharynx: No oropharyngeal exudate or  posterior oropharyngeal erythema.   Eyes:      Conjunctiva/sclera: Conjunctivae normal.      Pupils: Pupils are equal, round, and reactive to light.   Cardiovascular:      Rate and Rhythm: Normal rate and regular rhythm.      Pulses: Normal pulses.      Heart sounds: Normal heart sounds, S1 normal and S2 normal.   Pulmonary:      Effort: Pulmonary effort is normal.      Breath sounds: Normal breath sounds.   Abdominal:      General: Abdomen is flat.   Musculoskeletal:         General: No swelling or tenderness.      Right lower leg: No edema.      Left lower leg: No edema.   Skin:     General: Skin is warm and dry.   Neurological:      General: No focal deficit present.      Mental Status: He is alert and oriented to person, place, and time. Mental status is at baseline.   Psychiatric:         Mood and Affect: Mood normal.         Behavior: Behavior normal. Behavior is cooperative.         Thought Content: Thought content normal.        Data Reviewed:  POCT Covid testing - Positive  Recent lab results reviewed and noted.    Assessment/Plan       ICD-10-CM    1. COVID-19  U07.1       2. Body aches  R52 POCT Rapid Covid & Flu ([facility])      3. Fatigue, unspecified type  R53.83       4. Sinus congestion  R09.81         Orders Placed This Encounter    POCT Rapid Covid & Flu ([facility])    nirmatrelvir-ritonavir (PAXLOVID) 300 mg (150 mg x 2)-'100mg'$  tablet therapy pack    promethazine-dextromethorphan (PHENERGAN-DM) 6.25-15 mg/5 mL Oral Syrup     Health Maintenance:   Patient tested positive for Covid-19. Patients is on day 4 since symptom onset and his most recent GFR was 76. I ordered Paxlovid  for the patients viral infection. I also ordered Promethazine-DM to assist with symptom management. I instructed the patient on the importance of hydration status during acute infection.    Patient counseling:  The patient was given the opportunity to ask questions and those questions were answered to the patient's satisfaction. The  patient was encouraged to call with any additional questions or concerns. I instructed the patient to call back if symptoms persist or worsen.   Discussed with patient, the effects and side effects of medications. Medication safety was discussed. A copy of the patient's medication list was printed and given to the patient. A good faith effort was made to reconcile the patient's medications.   Follow up: Return if symptoms worsen or fail to improve.  Waylan Boga, APRN,NP-C

## 2022-04-21 NOTE — Nursing Note (Signed)
04/21/22 0926   Recent Weight Change   Have you had a recent unexplained weight loss or gain? N   Health Education and Literacy   How often do you have a problem understanding what is told to you about your medical condition?  Never   Domestic Violence   Because we are aware of abuse and domestic violence today, we ask all patients: Are you being hurt, hit, or frightened by anyone at your home or in your life?  N   Basic Needs   Do you have any basic needs within your home that are not being met? (such as Food, Shelter, Games developer, Tranportation, paying for bills and/or medications) N   Advanced Directives   Do you have any advanced directives? No Advance

## 2022-04-22 ENCOUNTER — Other Ambulatory Visit (INDEPENDENT_AMBULATORY_CARE_PROVIDER_SITE_OTHER): Payer: Self-pay | Admitting: Family Medicine

## 2022-04-23 ENCOUNTER — Other Ambulatory Visit (INDEPENDENT_AMBULATORY_CARE_PROVIDER_SITE_OTHER): Payer: Self-pay | Admitting: Family Medicine

## 2022-05-11 ENCOUNTER — Other Ambulatory Visit (INDEPENDENT_AMBULATORY_CARE_PROVIDER_SITE_OTHER): Payer: Self-pay | Admitting: Family Medicine

## 2022-05-17 ENCOUNTER — Other Ambulatory Visit: Payer: Self-pay

## 2022-05-17 ENCOUNTER — Ambulatory Visit (INDEPENDENT_AMBULATORY_CARE_PROVIDER_SITE_OTHER): Payer: Medicare Other | Admitting: Family Medicine

## 2022-05-17 ENCOUNTER — Encounter (INDEPENDENT_AMBULATORY_CARE_PROVIDER_SITE_OTHER): Payer: Self-pay | Admitting: Family Medicine

## 2022-05-17 VITALS — BP 148/83 | HR 75 | Temp 98.1°F | Resp 20 | Ht 69.0 in | Wt 254.0 lb

## 2022-05-17 DIAGNOSIS — I10 Essential (primary) hypertension: Secondary | ICD-10-CM

## 2022-05-17 DIAGNOSIS — E559 Vitamin D deficiency, unspecified: Secondary | ICD-10-CM

## 2022-05-17 DIAGNOSIS — G47419 Narcolepsy without cataplexy: Secondary | ICD-10-CM

## 2022-05-17 DIAGNOSIS — G35 Multiple sclerosis: Secondary | ICD-10-CM

## 2022-05-17 DIAGNOSIS — R351 Nocturia: Secondary | ICD-10-CM

## 2022-05-17 DIAGNOSIS — G500 Trigeminal neuralgia: Secondary | ICD-10-CM

## 2022-05-17 DIAGNOSIS — Z136 Encounter for screening for cardiovascular disorders: Secondary | ICD-10-CM

## 2022-05-17 DIAGNOSIS — E782 Mixed hyperlipidemia: Secondary | ICD-10-CM

## 2022-05-17 DIAGNOSIS — N401 Enlarged prostate with lower urinary tract symptoms: Secondary | ICD-10-CM

## 2022-05-17 MED ORDER — NAPROXEN 500 MG TABLET
500.0000 mg | ORAL_TABLET | Freq: Two times a day (BID) | ORAL | 1 refills | Status: DC
Start: 2022-05-17 — End: 2023-03-22

## 2022-05-17 NOTE — Progress Notes (Signed)
FAMILY MEDICINE, MEDICAL OFFICE BUILDING  [address]  [place] [address]       Name: Carlos Friedman MRN:  [id number]   Date: 05/17/2022 Age: 67 y.o.          Provider: Elliot Gault, DO    Reason for visit: Follow Up 6 Months      History of Present Illness:  05/17/2022:  This 67 year old male returns for six-month follow-up last having had labs in July complains of joint pain arthritis 6 on a scale of 1-10 was prescribed Naprosyn but he says his insurance would not pay for it.  He was seen on the 1st of the month with COVID and all symptoms have resolved.  His neurologist Dr. Alfonso Ramus who follows him for multiple sclerosis in narcolepsy states that the patient was started on gabapentin because of neuropathy.  He is tolerating it well.  Unfortunately he did not have any labs done so we do not have those to go over but I know why the insurance would not cover Naprosyn which is generic.  I am going to send a prescription to again  Historical Data    Past Medical History:  Past Medical History:   Diagnosis Date    Arthralgia     Benign prostatic hyperplasia with lower urinary tract symptoms     Calculus of kidney     Chronic bilateral low back pain without sciatica     Corporo-venous occlusive erectile dysfunction     Dejerine Roussy syndrome     Hyperalgesia     Hyperglycemia     Kidney stones     Male erectile dysfunction, unspecified     Neuromuscular dysfunction of bladder, unspecified     OSA on CPAP     Roussy-Levy syndrome          Past Surgical History:  Negative.      Allergies:  Allergies   Allergen Reactions    Penicillin Nausea/ Vomiting     Medications:  Current Outpatient Medications   Medication Sig    cholecalciferol, vitamin D3, 50 mcg (2,000 unit) Oral Tablet Take 1 Tablet (2,000 Units total) by mouth Once a day    diclofenac sodium (VOLTAREN) 1 % Gel Apply 2 g topically Four times a day    fenofibrate micronized (LOFIBRA) 200 mg Oral Capsule TAKE 1 CAPSULE (200 MG TOTAL) BY MOUTH ONCE A  DAY    gabapentin (NEURONTIN) 300 mg Oral Capsule Take 1 Capsule (300 mg total) by mouth Three times a day    glatiramer 20 mg/mL Subcutaneous Syringe Inject 1 mL (20 mg total) under the skin Once a day    levothyroxine (SYNTHROID) 25 mcg Oral Tablet TAKE 1 TABLET (25 MCG TOTAL) BY MOUTH ONCE A DAY    naproxen (NAPROSYN) 500 mg Oral Tablet Take 1 Tablet (500 mg total) by mouth Twice daily with food Indications: joint damage causing pain and loss of function    tamsulosin (FLOMAX) 0.4 mg Oral Capsule TAKE 1 CAPSULE (0.4 MG TOTAL) BY MOUTH EVERY DAY (Patient taking differently: Take 1 Capsule (0.4 mg total) by mouth Once a day)     Family History:  Family Medical History:       Problem Relation (Age of Onset)    COPD Other    Hypertension (High Blood Pressure) Other            Social History:  Social History     Socioeconomic History    Marital status: Married  Tobacco Use    Smoking status: Former     Current packs/day: 0.00     Average packs/day: 1 pack/day for 20.0 years (20.0 ttl pk-yrs)     Types: Cigarettes     Start date: 53     Quit date: 2005     Years since quitting: 19.2    Smokeless tobacco: Never   Vaping Use    Vaping status: Never Used   Substance and Sexual Activity    Alcohol use: Yes     Comment: socially    Drug use: Never           Review of Systems:  Any pertinent Review of Systems as addressed in the HPI above.    Physical Exam:  Vital Signs:  Vitals:    05/17/22 1018 05/17/22 1024   BP: (!) 153/78 (!) 148/83   Pulse: 75    Resp: 20    Temp: 36.7 C (98.1 F)    TempSrc: Temporal    SpO2: 99%    Weight: 115 kg (254 lb)    Height: 1.753 m (5\' 9" )    BMI: 37.59      Physical Exam  Vitals and nursing note reviewed.   Constitutional:       General: He is awake.      Appearance: Normal appearance. He is well-developed and well-groomed. He is obese.   HENT:      Head: Normocephalic and atraumatic.      Right Ear: Tympanic membrane, ear canal and external ear normal.      Left Ear: Tympanic membrane,  ear canal and external ear normal.      Nose: Nose normal.      Mouth/Throat:      Mouth: Mucous membranes are moist.      Pharynx: Oropharynx is clear.   Eyes:      Extraocular Movements: Extraocular movements intact.      Conjunctiva/sclera: Conjunctivae normal.      Pupils: Pupils are equal, round, and reactive to light.   Cardiovascular:      Rate and Rhythm: Normal rate and regular rhythm.      Pulses: Normal pulses.      Heart sounds: Normal heart sounds, S1 normal and S2 normal.   Pulmonary:      Effort: Pulmonary effort is normal.      Breath sounds: Normal breath sounds.   Abdominal:      General: Abdomen is protuberant.      Palpations: Abdomen is soft.   Musculoskeletal:         General: Normal range of motion.      Cervical back: Normal range of motion and neck supple.   Skin:     General: Skin is warm and dry.      Capillary Refill: Capillary refill takes less than 2 seconds.   Neurological:      General: No focal deficit present.      Mental Status: He is alert and oriented to person, place, and time. Mental status is at baseline.      Cranial Nerves: Cranial nerves 2-12 are intact.      Sensory: Sensation is intact.      Motor: Tremor present.      Coordination: Coordination is intact.      Gait: Gait abnormal.   Psychiatric:         Mood and Affect: Mood normal.         Behavior: Behavior normal. Behavior is cooperative.  Assessment:    ICD-10-CM    1. Multiple sclerosis (CMS HCC)  G35       2. Essential (primary) hypertension  I10 LIPID PANEL     HEPATIC FUNCTION PANEL     CBC/DIFF     URINALYSIS, MACROSCOPIC AND MICROSCOPIC W/CULTURE REFLEX     BASIC METABOLIC PANEL     THYROID STIMULATING HORMONE (SENSITIVE TSH)      3. Mixed hyperlipidemia  E78.2       4. Screening for AAA (abdominal aortic aneurysm)  Z13.6 ABDOMINAL DUPLEX - [facility] - MEDICARE SCREEN FOR AAA      5. 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 Trigeminal neuralgia  G50.0       7. Vitamin D deficiency  E55.9       8.  Benign prostatic hyperplasia with nocturia  N40.1     R35.1          Plan:  Orders Placed This Encounter    LIPID PANEL    HEPATIC FUNCTION PANEL    CBC/DIFF    URINALYSIS, MACROSCOPIC AND MICROSCOPIC W/CULTURE REFLEX    BASIC METABOLIC PANEL    THYROID STIMULATING HORMONE (SENSITIVE TSH)    ABDOMINAL DUPLEX - [facility] - MEDICARE SCREEN FOR AAA    naproxen (NAPROSYN) 500 mg Oral Tablet     Put the prescription in for Naprosyn.  Also ordered an abdominal aortic aneurysm screen.  Ordered a lipid panel hepatic function panel CBC with diff urinalysis basic metabolic panel thyroid-stimulating hormone.  Patient should be taking vitamin-D 2000 units twice a day to reduce the risk of flares against MS.  He will get an MRI this summer.  He is doing well with the gabapentin for the neuropathy this may also help with his arthritis.  He is to call us back and let us know if the insurance does not pay for his Naprosyn they may have to pay for it himself he will continue the levothyroxine fenofibrate tamsulosin MS drug vitamin-D 2000 units twice a day oxy carbamazepine 150 twice a day and pravastatin 40.  More than 50% of the visit was spent counseling and coordinating patient care.  All questions were answered to the satisfaction of the patient.  Patient should get a Tdap Prevnar 20 and a shingles vaccine.  He should have a colonoscopy but he refused 1.  His tamsulosin is helping with his BPH.    Return in about 6 months (around 11/17/2022).    Elliot Gault, DO     Portions of this note may be dictated using voice recognition software or a dictation service. Variances in spelling and vocabulary are possible and unintentional. Not all errors are caught/corrected. Please notify the [facility] if any discrepancies are noted or if the meaning of any statement is not clear.

## 2022-05-17 NOTE — Nursing Note (Signed)
05/17/22 1016   Domestic Violence   Because we are aware of abuse and domestic violence today, we ask all patients: Are you being hurt, hit, or frightened by anyone at your home or in your life?  N   Basic Needs   Do you have any basic needs within your home that are not being met? (such as Food, Shelter, Games developer, Tranportation, paying for bills and/or medications) N

## 2022-07-06 ENCOUNTER — Inpatient Hospital Stay
Admission: RE | Admit: 2022-07-06 | Discharge: 2022-07-06 | Disposition: A | Discharge status: Home / Self Care | Payer: Medicare Other | Source: Ambulatory Visit | Attending: Family Medicine | Admitting: Family Medicine

## 2022-07-06 ENCOUNTER — Other Ambulatory Visit: Payer: Self-pay

## 2022-07-06 DIAGNOSIS — Z136 Encounter for screening for cardiovascular disorders: Secondary | ICD-10-CM

## 2022-07-06 DIAGNOSIS — Z87891 Personal history of nicotine dependence: Secondary | ICD-10-CM | POA: Insufficient documentation

## 2022-08-16 IMAGING — MR MRI CERVICAL SPINE WITHOUT CONTRAST
4 of 6 series · 22 of 48 positions shown · IV contrast (gadolinium)
Comparison: No prior imaging studies of C-spine are available for comparison.  Part of C-spine is visible up to C2 level on previous MRI dated 10/07/2021.

﻿EXAM:  [id number]   MRI CERVICAL SPINE WITHOUT CONTRAST
INDICATION: 66-year-old with history of multiple sclerosis.  Patient reports new symptoms of right lower extremity weakness and pain.
TECHNIQUE: Multiplanar, multisequential MRI of the C-spine was performed without gadolinium contrast.

[Series 5: T2 · sagittal · 3.0mm · 0.75mm/px · 6 of 13 slices shown (1 of 3)]
[im 1/13]
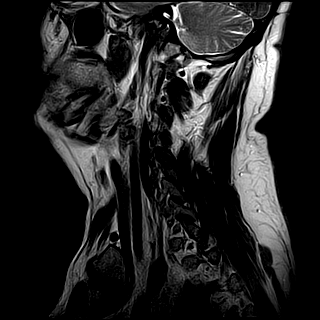
[im 3/13]
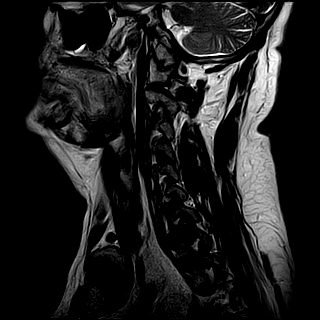
[im 5/13]
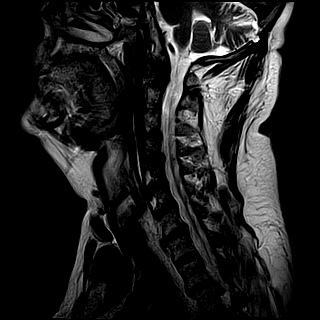
[im 8/13]
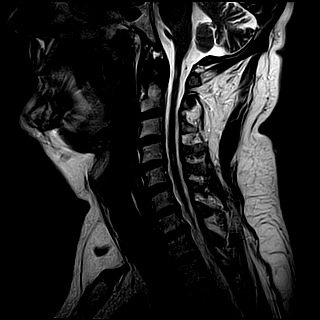
[im 10/13]
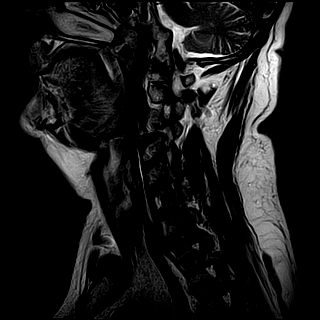
[im 13/13]
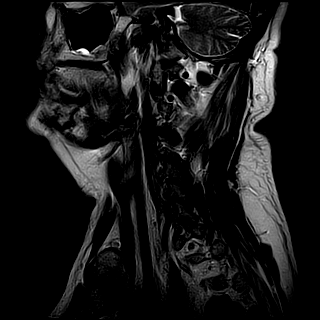

[Series 6: T1 · sagittal · 3.0mm · 0.47mm/px · 3 of 13 slices shown]
[im 3/13]
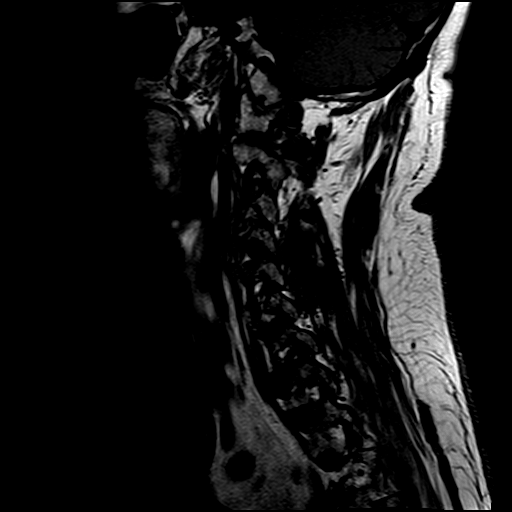
[im 8/13]
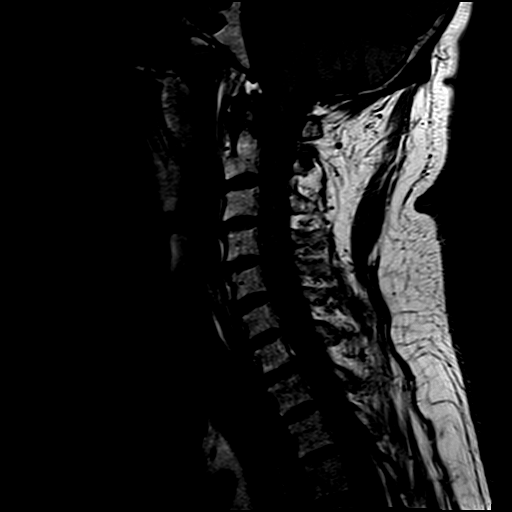
[im 13/13]
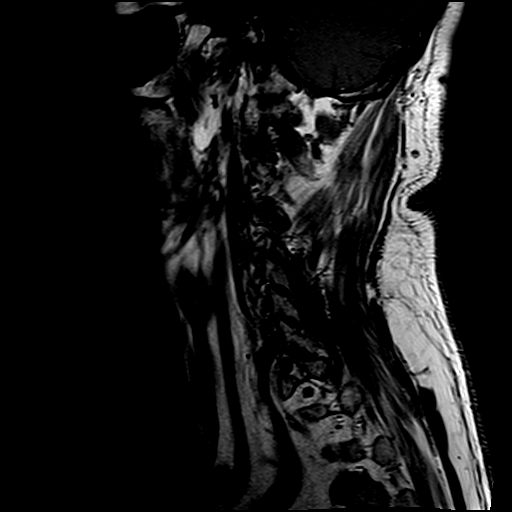

[Series 8: T2 · axial · 3.0mm · 0.39mm/px · z∈[-102,+11]mm · 9 of 18 slices shown (2 of 3)]
[im 1/18]
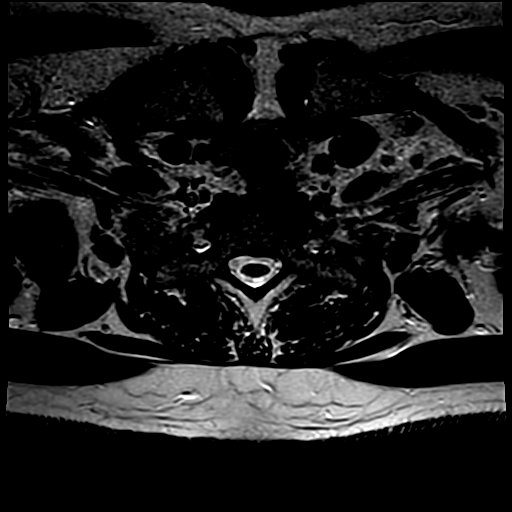
[im 3/18]
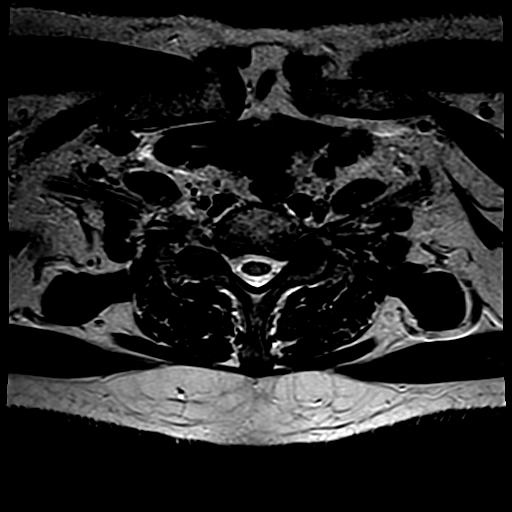
[im 5/18]
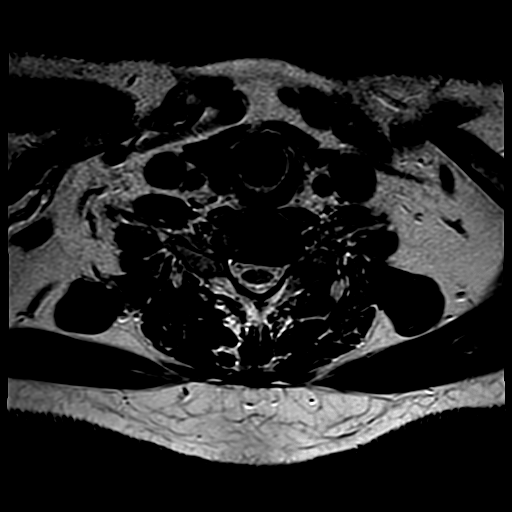
[im 7/18]
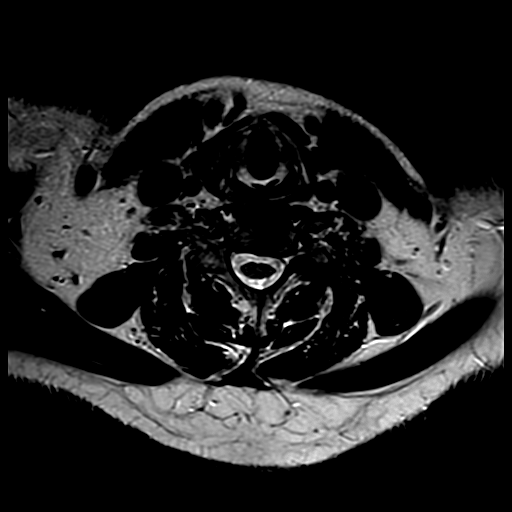
[im 9/18]
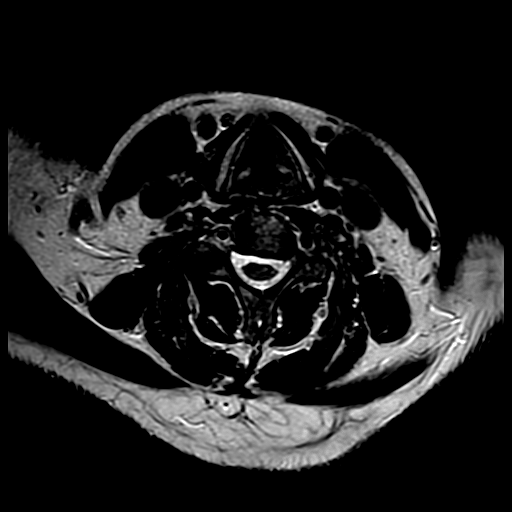
[im 11/18]
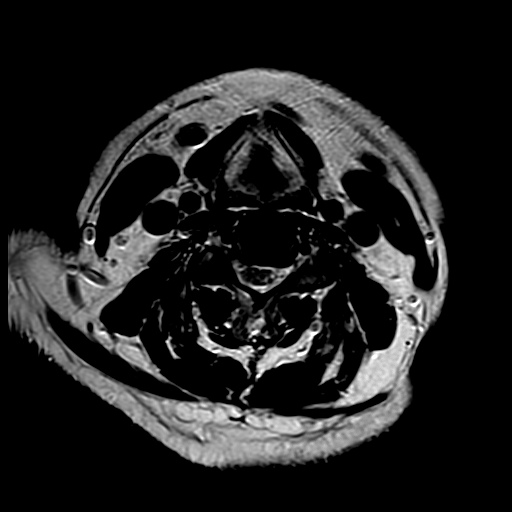
[im 13/18]
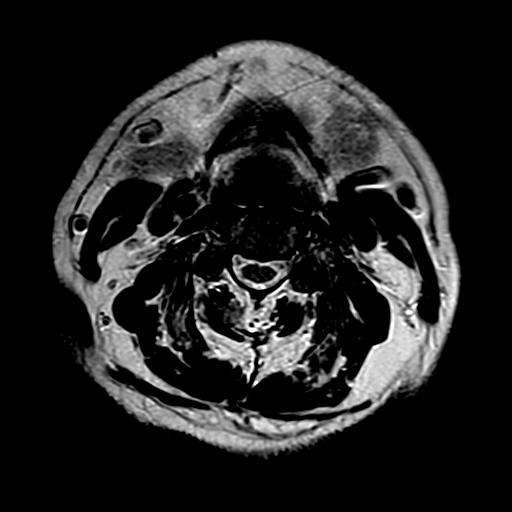
[im 15/18]
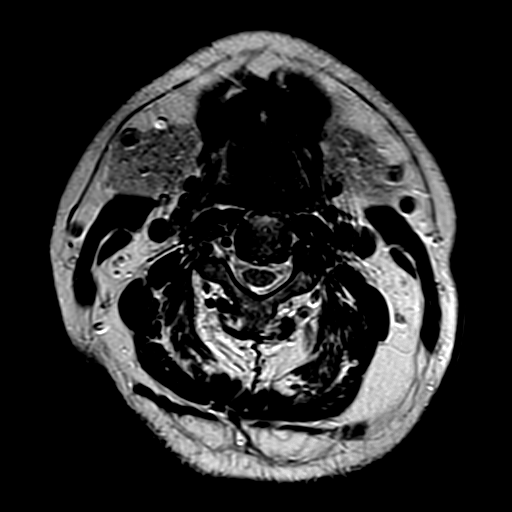
[im 18/18]
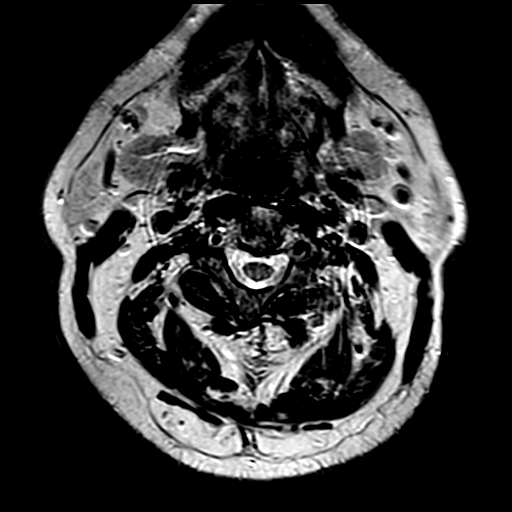

[Series 10: T2 · axial · 3.0mm · 0.35mm/px · z∈[-42,+46]mm · 4 of 26 slices shown (3 of 3)]
[im 1/26]
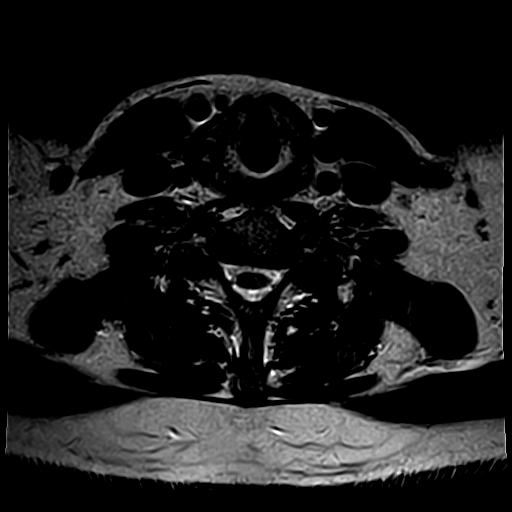
[im 5/26]
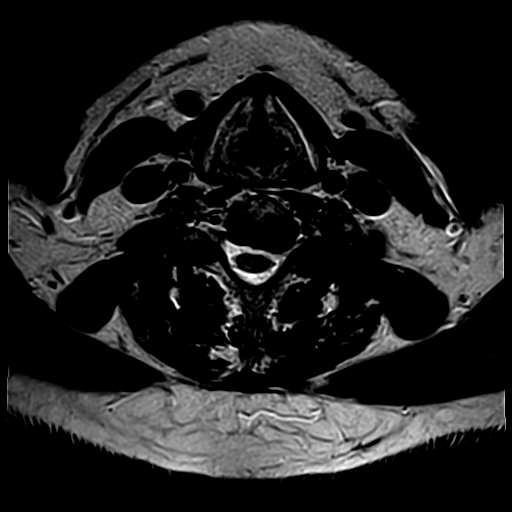
[im 14/26]
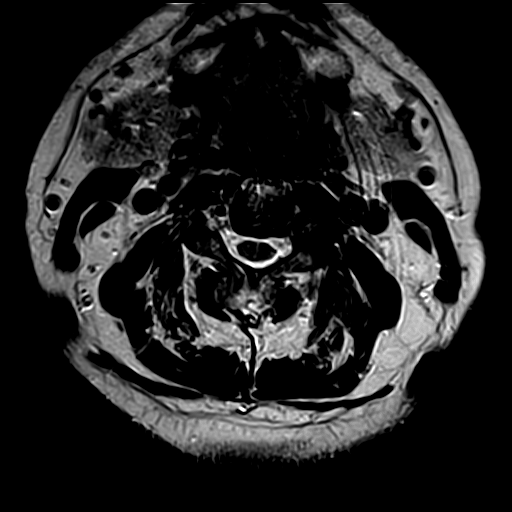
[im 23/26]
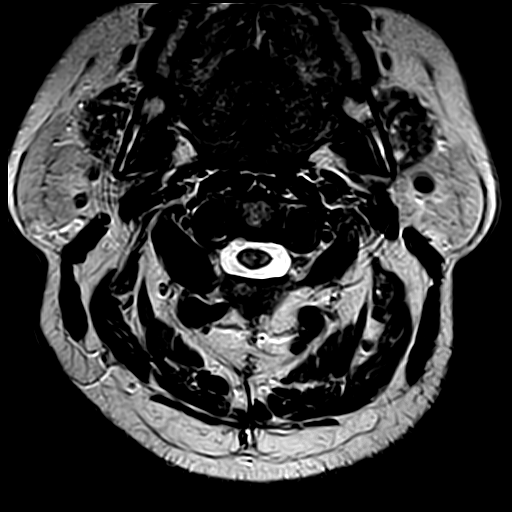

[22 of 48 positions shown; findings below may reference images not displayed]

FINDINGS: Abnormal, poorly defined FLAIR and T2 bright lesions are seen at C1-2, C2 and C2-3 levels.  More poorly defined FLAIR T2 signal lesion is noted at C7 level.  No significant cord atrophy is noted in the cervical region.

Degenerative disc disease at C4-5 level causing mild foraminal narrowing.  Moderate bilateral foraminal narrowing due to degenerative disc changes at C5-6 level. Significant left foraminal narrowing at C7-T1 level due to degenerative process. 

Paravertebral soft tissues are unremarkable.
IMPRESSION: 1. Definite cord signal lesions are seen with poorly defined increased T2 and FLAIR signal lesions at C1, C2 and C2-3 levels are seen.  These lesions are probably unchanged in appearance from limited visualization of the same area on MRI brain dated 10/07/2021.

[DATE]. Poorly defined FLAIR bright lesion at C7 level in the lower cervical spine.  No evidence of cord atrophy. 

3. Degenerative disc changes causing foraminal narrowing as described above at C4-5, C5-6 and C7-T1 levels.

## 2022-08-16 IMAGING — MR MRI BRAIN W/O CONTRAST
9 of 10 series · 37 of 48 positions shown · non-contrast
Comparison: MRI brain dated 10/07/2021.

﻿EXAM:  MRI BRAIN W/O CONTRAST
INDICATION: Follow-up. Multiple sclerosis.  Patient is reporting new onset right lower extremity weakness.
TECHNIQUE: Multiplanar, multisequential MRI of the brain was performed administration of contrast.

[Series 5: DWI · axial · 5.0mm · 1.35mm/px · z∈[-25,+101]mm · 8 of 88 slices shown (1 of 3)]
[im 1/88]
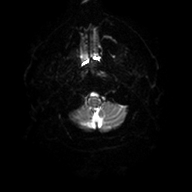
[im 14/88]
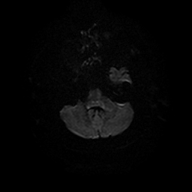
[im 27/88]
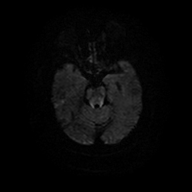
[im 41/88]
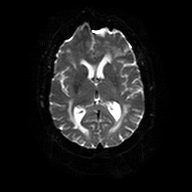
[im 47/88]
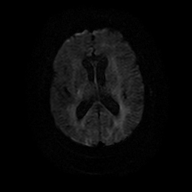
[im 61/88]
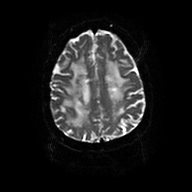
[im 74/88]
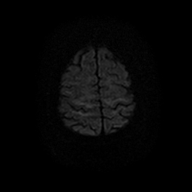
[im 88/88]
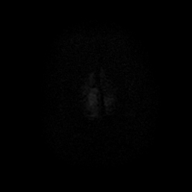

[Series 6: DWI · axial · 5.0mm · 1.35mm/px · z∈[-25,+101]mm · 4 of 22 slices shown (2 of 3)]
[im 1/22]
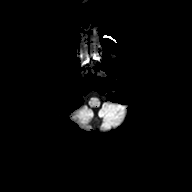
[im 8/22]
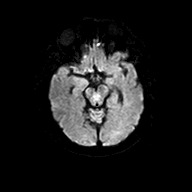
[im 15/22]
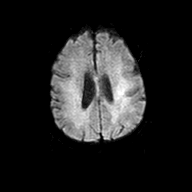
[im 22/22]
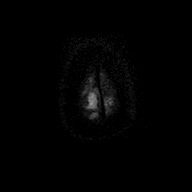

[Series 7: DWI · axial · 5.0mm · 1.35mm/px · z∈[-25,+101]mm · 3 of 22 slices shown (3 of 3)]
[im 1/22]
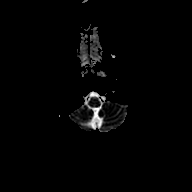
[im 11/22]
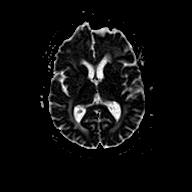
[im 22/22]
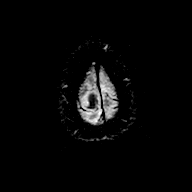

[Series 8: FLAIR · sagittal · 5.0mm · 0.75mm/px · 4 of 28 slices shown (1 of 2)]
[im 1/28]
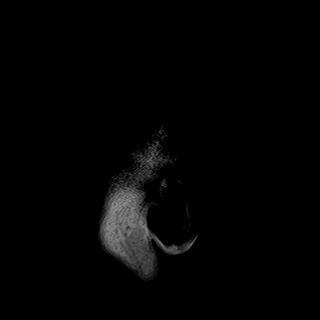
[im 10/28]
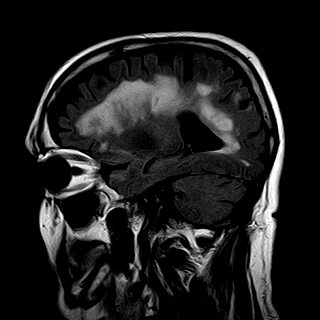
[im 19/28]
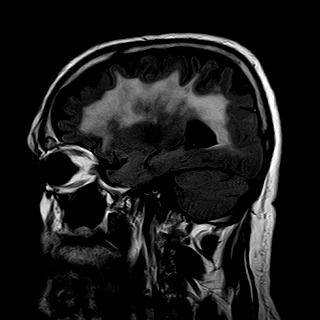
[im 28/28]
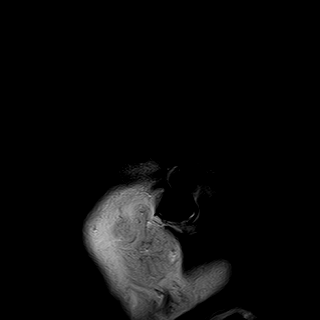

[Series 9: PD · axial · 4.0mm · 0.43mm/px · z∈[-37,+117]mm · 5 of 36 slices shown (1 of 2)]
[im 1/36]
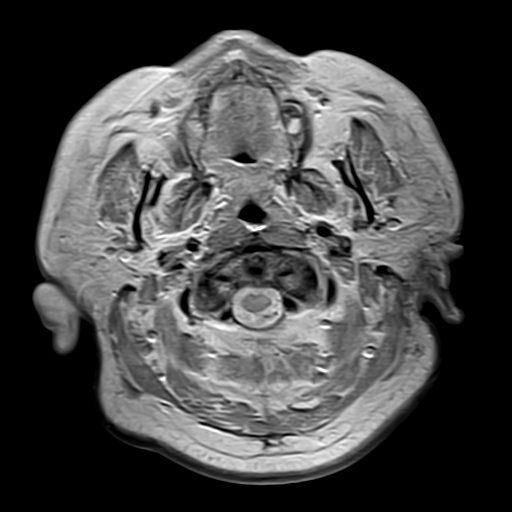
[im 9/36]
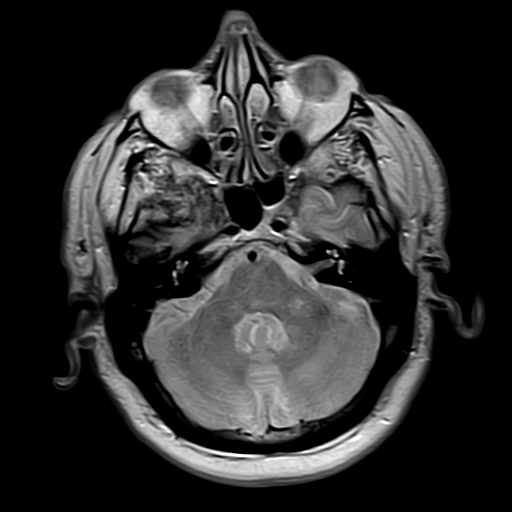
[im 18/36]
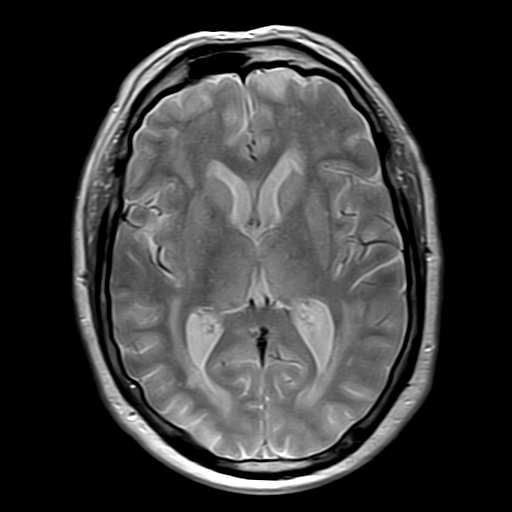
[im 27/36]
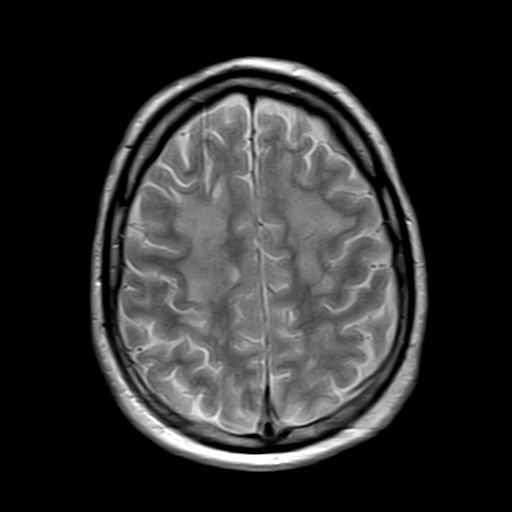
[im 36/36]
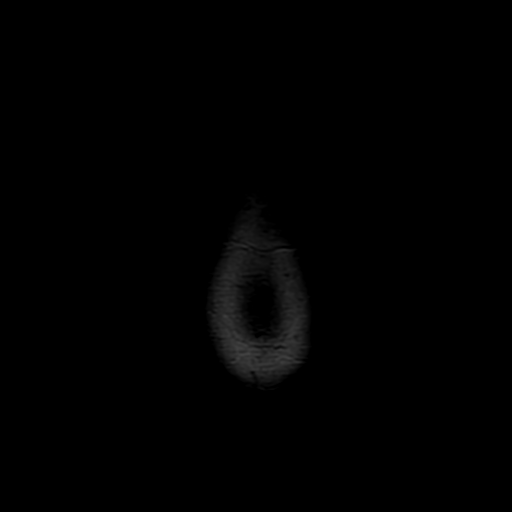

[Series 10: PD · axial · 4.0mm · 0.43mm/px · z∈[-37,+38]mm · 3 of 36 slices shown (2 of 2)]
[im 1/36]
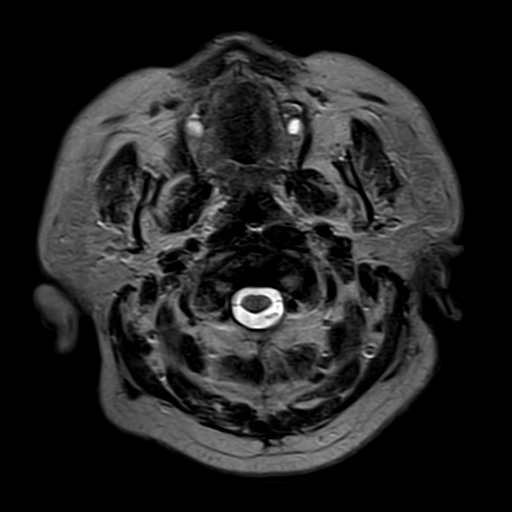
[im 9/36]
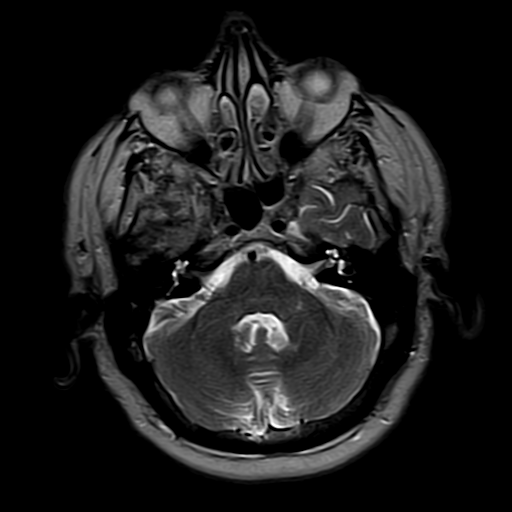
[im 18/36]
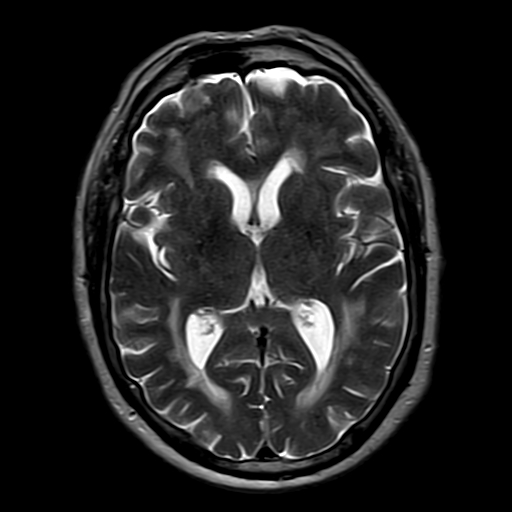

[Series 11: FLAIR · axial · 5.0mm · 0.76mm/px · z∈[-23,+103]mm · 3 of 22 slices shown (2 of 2)]
[im 1/22]
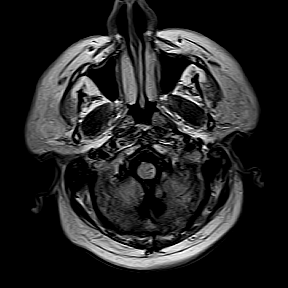
[im 11/22]
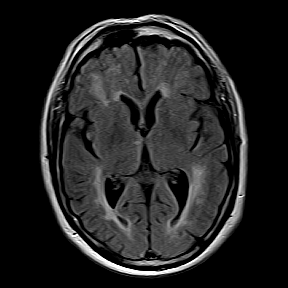
[im 22/22]
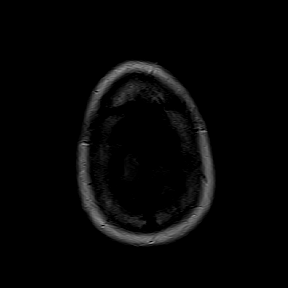

[Series 12: T1 · axial · 5.0mm · 0.69mm/px · z∈[-23,+103]mm · 3 of 22 slices shown]
[im 1/22]
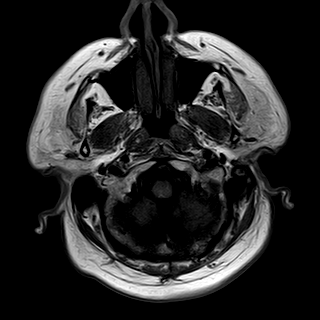
[im 11/22]
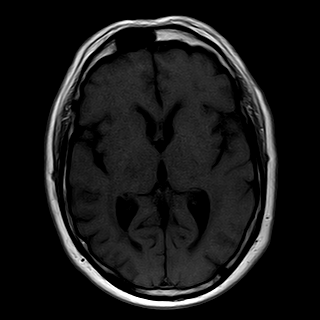
[im 22/22]
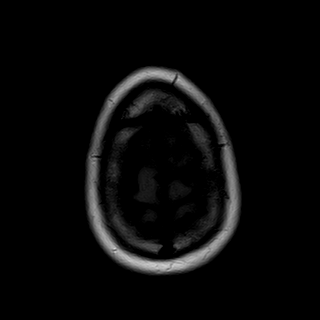

[Series 14: T2 · coronal · 5.0mm · 0.43mm/px · 4 of 28 slices shown]
[im 1/28]
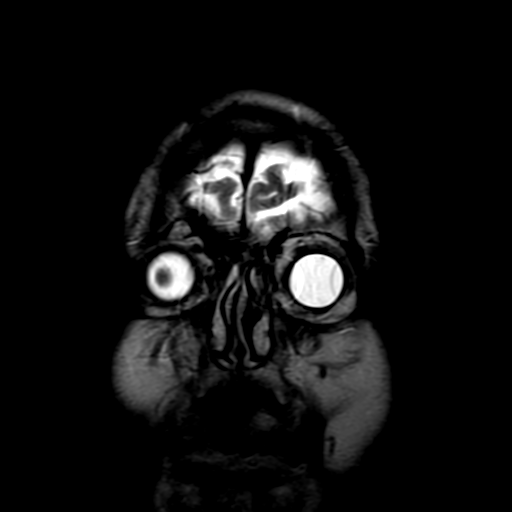
[im 10/28]
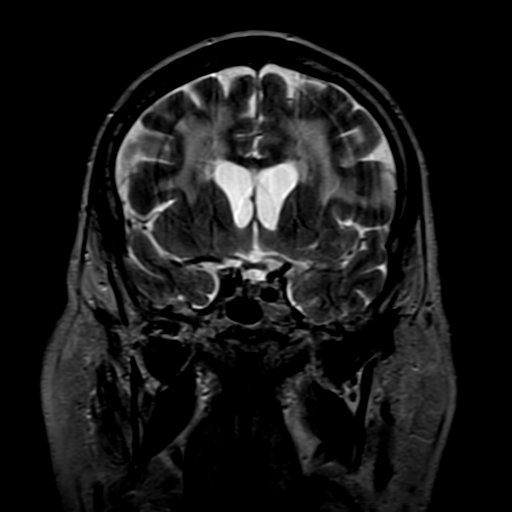
[im 19/28]
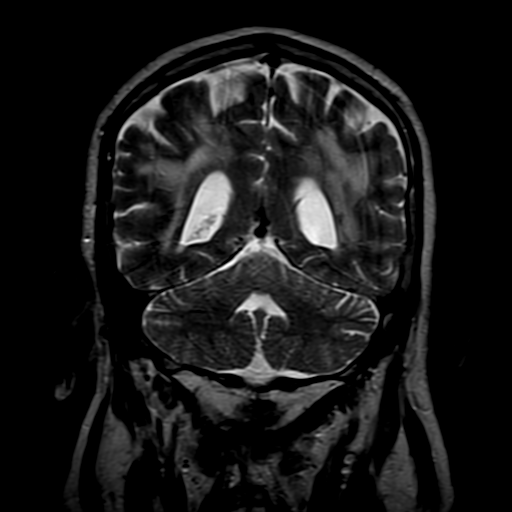
[im 28/28]
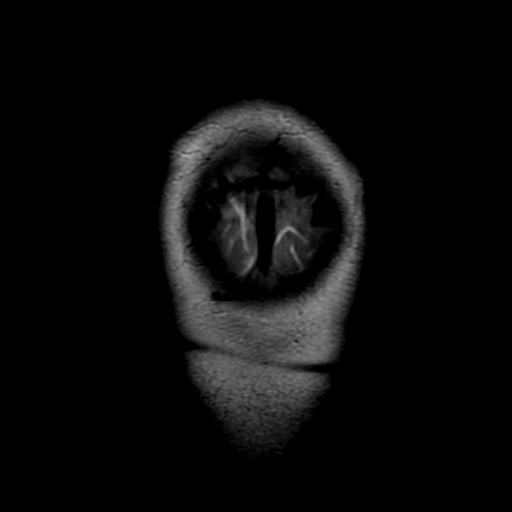

[37 of 48 positions shown; findings below may reference images not displayed]

FINDINGS: No focal areas of restricted diffusion on the diffusion sequence. Extensive demyelinating lesions of the brain are noted on both sides involving periventricular and subcortical white matter similar in appearance to previous examination.  Abnormal FLAIR signal in the inferior aspect of the corpus callosum is also noted similar to previous examination.

Stable moderate symmetric cerebral cortical atrophy. Stable FLAIR signal abnormality of the cerebellum posteriorly near the midline and stable FLAIR signal lesion of the midbrain similar to prior study.  Major arteries of circle of Willis and dural venous sinuses are patent.
IMPRESSION: 1. Stable extensive demyelinating lesions of the brain and stable abnormal FLAIR signal of corpus callosum.

2. Stable moderate symmetric cerebral atrophy. Stable findings of the cerebellum and midbrain.  Overall, no significant change from MRI findings of 10/07/2021.

## 2022-08-17 IMAGING — MR MRI THORACIC SPINE WITHOUT AND WITH CONTRAST
6 of 10 series · 27 of 48 positions shown · IV contrast (Gadavist)
Comparison: MRI brain and C-spine dated 08/16/2022.

﻿EXAM:  [id number]   MRI THORACIC SPINE WITHOUT AND WITH CONTRAST
INDICATION: History of multiple sclerosis.  Lower extremity weakness.
TECHNIQUE: Multiplanar, multisequential MRI of the thoracic spine was performed including postcontrast examination after injection of 10 mL Gadavist IV contrast.

[Series 5: T2 · sagittal · 4.0mm · 0.78mm/px · 5 of 13 slices shown (1 of 2)]
[im 1/13]
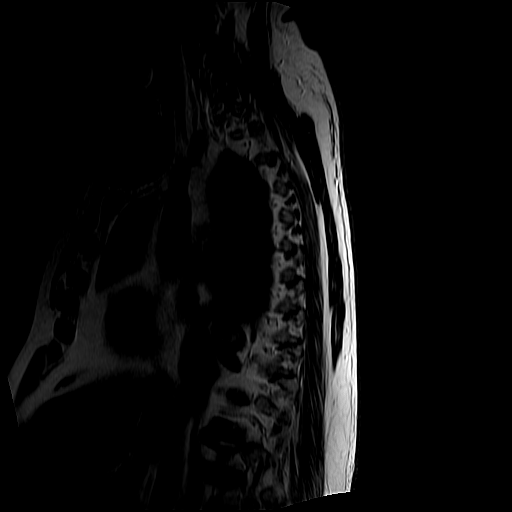
[im 4/13]
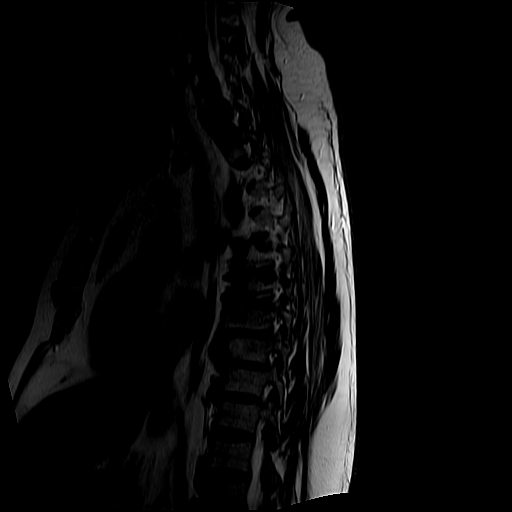
[im 7/13]
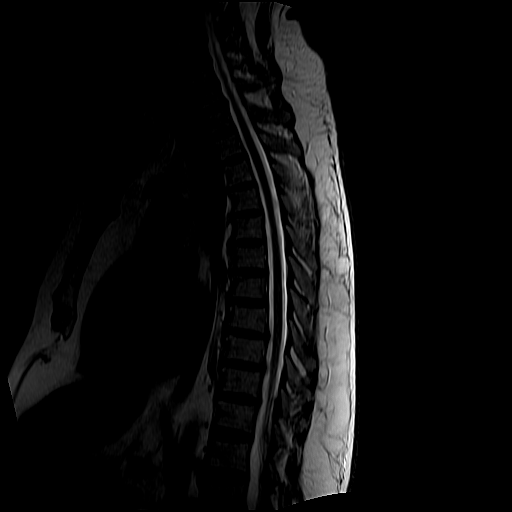
[im 10/13]
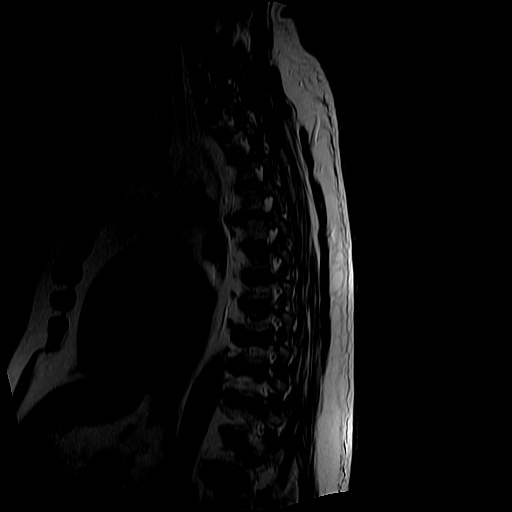
[im 13/13]
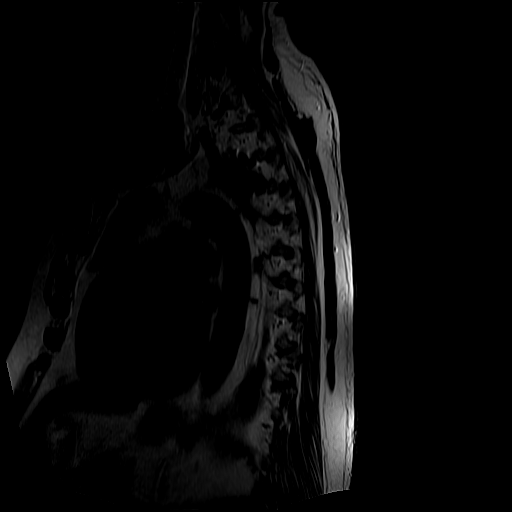

[Series 6: T1 · sagittal · 4.0mm · 0.78mm/px · 5 of 13 slices shown]
[im 1/13]
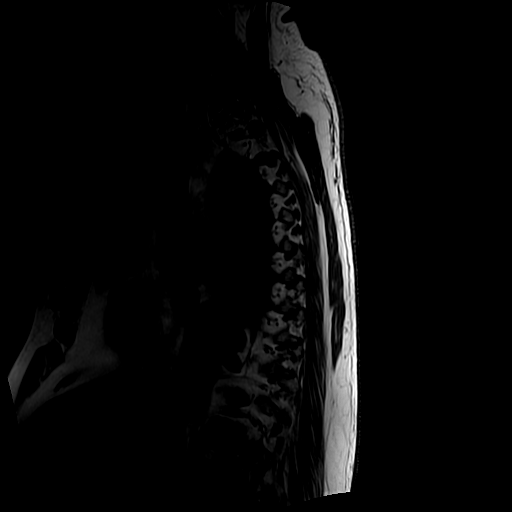
[im 4/13]
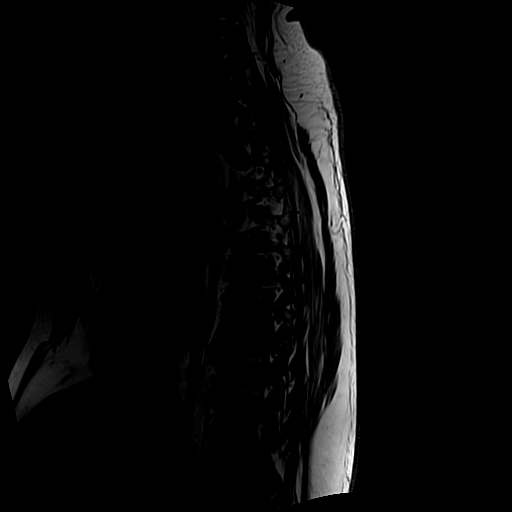
[im 7/13]
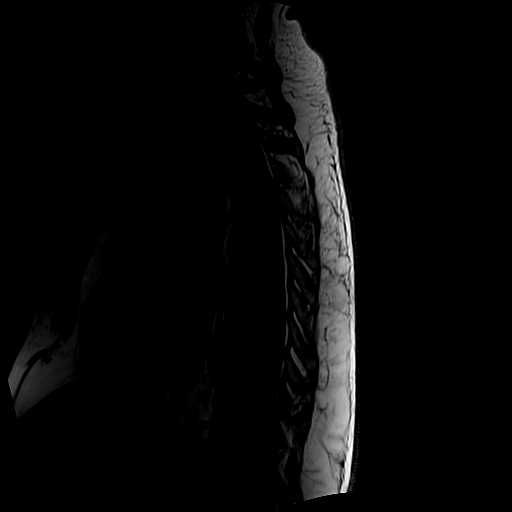
[im 10/13]
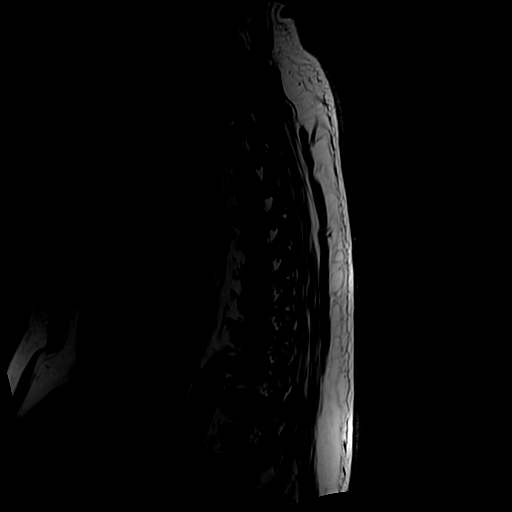
[im 13/13]
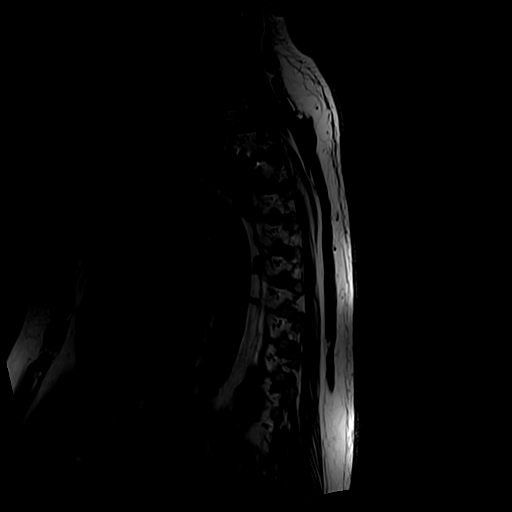

[Series 7: T1 fat-sat · sagittal · 4.0mm · 0.78mm/px · 5 of 13 slices shown (1 of 3)]
[im 1/13]
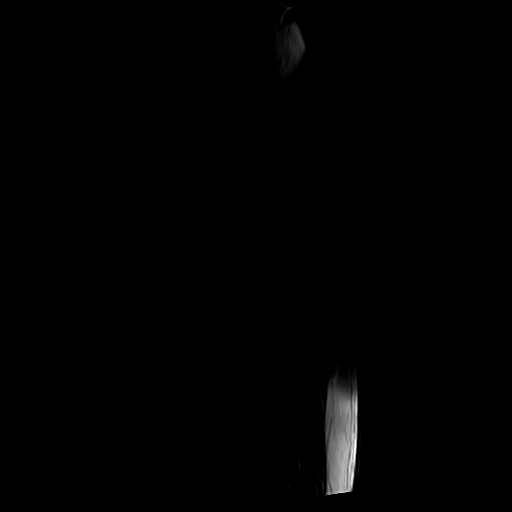
[im 4/13]
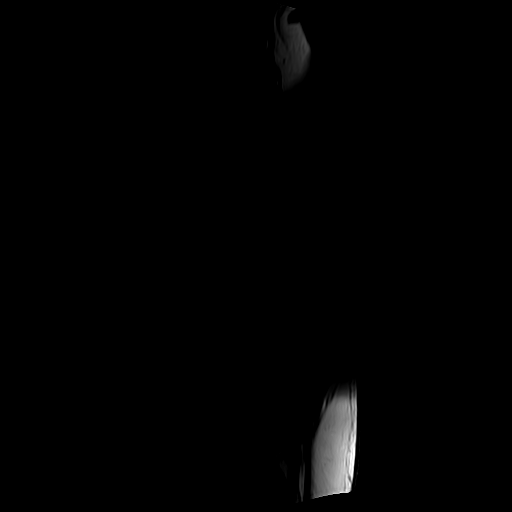
[im 7/13]
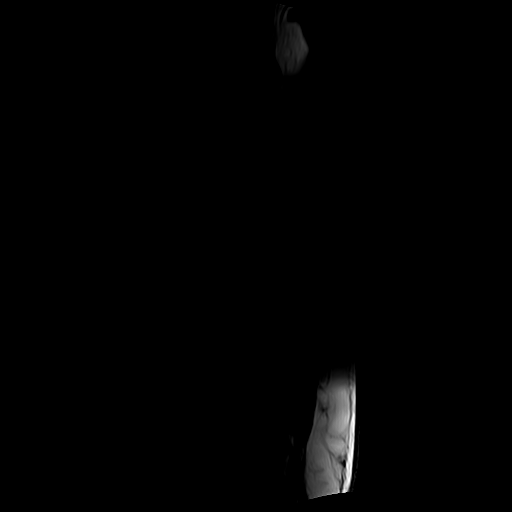
[im 10/13]
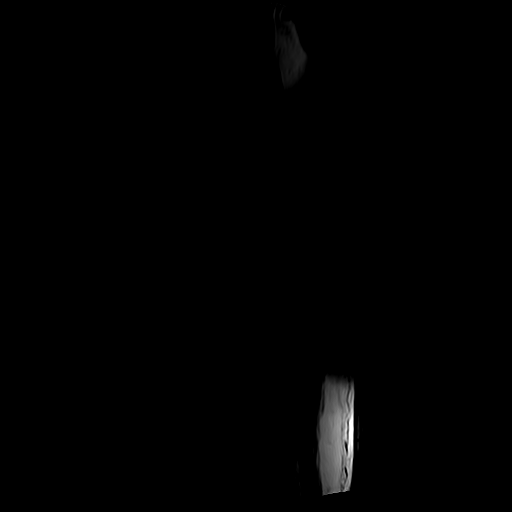
[im 13/13]
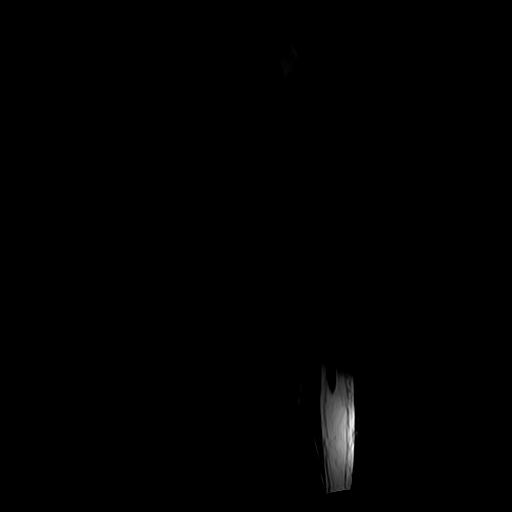

[Series 9: T2 · axial · 4.0mm · 0.62mm/px · z∈[-270,-67]mm · 5 of 12 slices shown (2 of 2)]
[im 1/12]
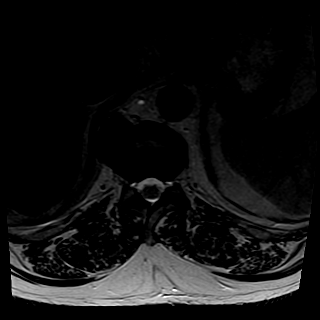
[im 3/12]
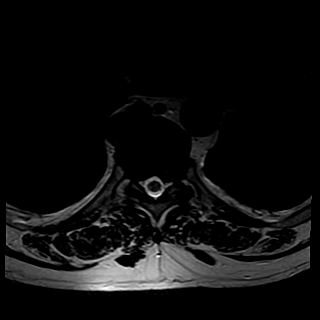
[im 6/12]
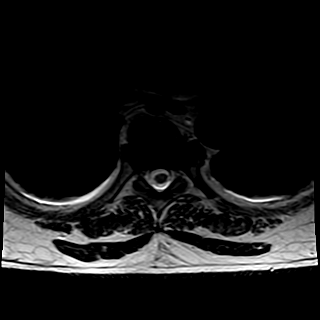
[im 9/12]
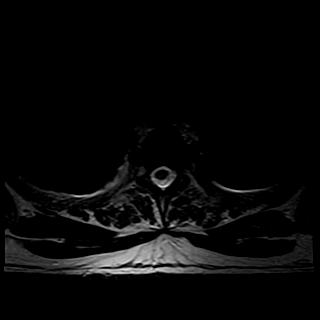
[im 12/12]
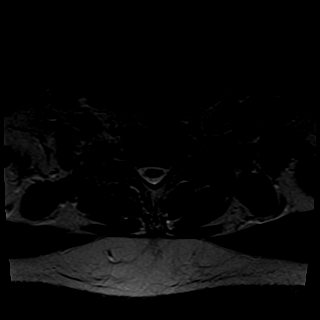

[Series 10: T1 fat-sat · axial · 4.0mm · 0.78mm/px · z∈[-270,-67]mm · 5 of 12 slices shown (2 of 3)]
[im 1/12]
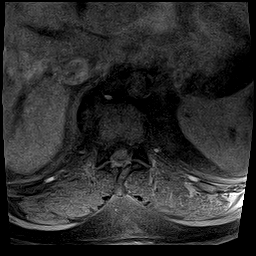
[im 3/12]
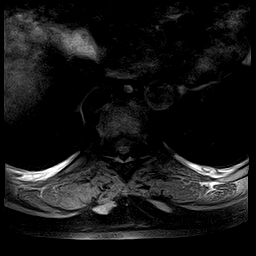
[im 6/12]
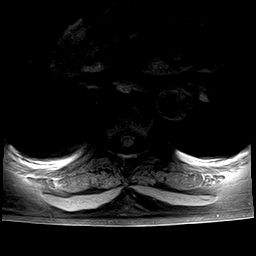
[im 9/12]
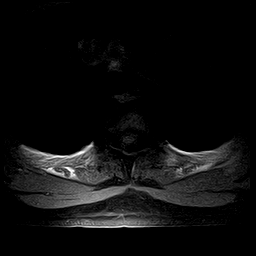
[im 12/12]
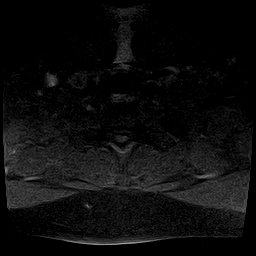

[Series 14: T1 fat-sat · axial · 4.0mm · 0.78mm/px · z∈[-270,-216]mm · 2 of 12 slices shown (3 of 3)]
[im 1/12]
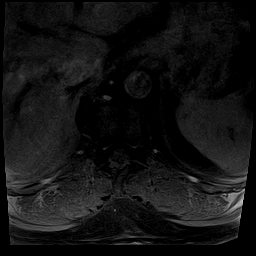
[im 3/12]
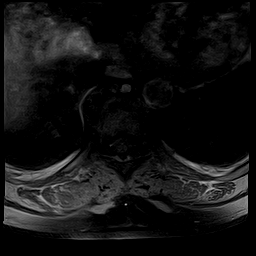

[27 of 48 positions shown; findings below may reference images not displayed]

FINDINGS: No focal bone changes of thoracic vertebrae are seen.

Thoracic spinal cord shows poorly defined T2, FLAIR bright area at mid T7 level suggestive of an area of likely demyelination.  No other focal lesions of the thoracic cord are seen.  No evidence of syrinx cavity is noted. 

No significant thoracic disc herniation is noted. Bulging annulus minimally compromising thecal sac in the midline is noted at T8-9 level.

Prominent anterior lateral osteophyte formation is noted at T11-T12 level without compromise of thecal sac or neural foramina.

Paravertebral soft tissues are unremarkable.
IMPRESSION: 1. Poorly defined nonenhancing area of increased T2 and FLAIR signal on the dorsal aspect of thoracic spinal cord at T7 level suggestive of an area of demyelination due to multiple sclerosis.

2. No evidence of syrinx cavity or other focal lesions of thoracic cord are seen.  No abnormal enhancement on postcontrast study. 

3. Degenerative disc changes as described above at T8-9 and T11-12 levels.

## 2022-11-22 ENCOUNTER — Other Ambulatory Visit: Payer: Self-pay

## 2022-11-22 ENCOUNTER — Other Ambulatory Visit (INDEPENDENT_AMBULATORY_CARE_PROVIDER_SITE_OTHER): Payer: Self-pay | Admitting: Family Medicine

## 2022-11-22 ENCOUNTER — Encounter (INDEPENDENT_AMBULATORY_CARE_PROVIDER_SITE_OTHER): Payer: Self-pay | Admitting: Family Medicine

## 2022-11-22 ENCOUNTER — Ambulatory Visit (INDEPENDENT_AMBULATORY_CARE_PROVIDER_SITE_OTHER): Payer: Medicare Other | Admitting: Family Medicine

## 2022-11-22 VITALS — BP 166/85 | HR 84 | Temp 98.6°F | Resp 19 | Ht 69.0 in | Wt 258.0 lb

## 2022-11-22 DIAGNOSIS — R351 Nocturia: Secondary | ICD-10-CM

## 2022-11-22 DIAGNOSIS — G500 Trigeminal neuralgia: Secondary | ICD-10-CM

## 2022-11-22 DIAGNOSIS — L0889 Other specified local infections of the skin and subcutaneous tissue: Secondary | ICD-10-CM

## 2022-11-22 DIAGNOSIS — G35 Multiple sclerosis: Secondary | ICD-10-CM

## 2022-11-22 DIAGNOSIS — N401 Enlarged prostate with lower urinary tract symptoms: Secondary | ICD-10-CM

## 2022-11-22 DIAGNOSIS — G47419 Narcolepsy without cataplexy: Secondary | ICD-10-CM

## 2022-11-22 DIAGNOSIS — Z23 Encounter for immunization: Secondary | ICD-10-CM

## 2022-11-22 DIAGNOSIS — E039 Hypothyroidism, unspecified: Secondary | ICD-10-CM

## 2022-11-22 DIAGNOSIS — E559 Vitamin D deficiency, unspecified: Secondary | ICD-10-CM

## 2022-11-22 DIAGNOSIS — I10 Essential (primary) hypertension: Secondary | ICD-10-CM

## 2022-11-22 DIAGNOSIS — E782 Mixed hyperlipidemia: Secondary | ICD-10-CM

## 2022-11-22 DIAGNOSIS — M1990 Unspecified osteoarthritis, unspecified site: Secondary | ICD-10-CM

## 2022-11-22 DIAGNOSIS — L02211 Cutaneous abscess of abdominal wall: Secondary | ICD-10-CM

## 2022-11-22 DIAGNOSIS — Z87891 Personal history of nicotine dependence: Secondary | ICD-10-CM

## 2022-11-22 DIAGNOSIS — Z0000 Encounter for general adult medical examination without abnormal findings: Secondary | ICD-10-CM

## 2022-11-22 DIAGNOSIS — Z1211 Encounter for screening for malignant neoplasm of colon: Secondary | ICD-10-CM | POA: Insufficient documentation

## 2022-11-22 MED ORDER — GABAPENTIN 300 MG CAPSULE
300.0000 mg | ORAL_CAPSULE | Freq: Three times a day (TID) | ORAL | 0 refills | Status: DC
Start: 2022-11-22 — End: 2023-05-23

## 2022-11-22 MED ORDER — CLINDAMYCIN HCL 300 MG CAPSULE
300.0000 mg | ORAL_CAPSULE | Freq: Four times a day (QID) | ORAL | 0 refills | Status: DC
Start: 2022-11-22 — End: 2023-05-23

## 2022-11-22 MED ORDER — LEVOTHYROXINE 25 MCG TABLET
25.0000 ug | ORAL_TABLET | Freq: Every day | ORAL | 1 refills | Status: DC
Start: 2022-11-22 — End: 2023-06-26

## 2022-11-22 MED ORDER — FENOFIBRATE MICRONIZED 200 MG CAPSULE
200.0000 mg | ORAL_CAPSULE | Freq: Every day | ORAL | 1 refills | Status: DC
Start: 2022-11-22 — End: 2023-06-26

## 2022-11-22 MED ORDER — TAMSULOSIN 0.4 MG CAPSULE
0.4000 mg | ORAL_CAPSULE | Freq: Every day | ORAL | 1 refills | Status: DC
Start: 2022-11-22 — End: 2023-09-12

## 2022-11-22 MED ORDER — TERIFLUNOMIDE 14 MG TABLET
14.0000 mg | ORAL_TABLET | Freq: Every day | ORAL | 1 refills | Status: DC
Start: 2022-11-22 — End: 2023-11-26

## 2022-11-22 MED ORDER — MUPIROCIN 2 % TOPICAL OINTMENT
TOPICAL_OINTMENT | Freq: Three times a day (TID) | CUTANEOUS | 2 refills | Status: DC
Start: 2022-11-22 — End: 2023-09-12

## 2022-11-22 NOTE — Nursing Note (Signed)
11/22/22 0924   Recent Weight Change   Have you had a recent unexplained weight loss or gain? N   Domestic Violence   Because we are aware of abuse and domestic violence today, we ask all patients: Are you being hurt, hit, or frightened by anyone at your home or in your life?  N   Basic Needs   Do you have any basic needs within your home that are not being met? (such as Food, Shelter, [employer] fast streamer, Tranportation, paying for bills and/or medications) N

## 2022-11-22 NOTE — Progress Notes (Signed)
FAMILY MEDICINE, MEDICAL OFFICE BUILDING  [address]  [place] [address]       Name: Carlos Friedman MRN:  [id number]   Date: 11/22/2022 Age: 67 y.o.          Provider: Mickey Farber, DO    Reason for visit: Follow Up 6 Months      History of Present Illness:  11/22/2022:  This 67 year old male returns for six-month follow-up to review his labs and get refills all his medications.  Still has burning on the right side of his body coldness in his right leg for which he has been put on gabapentin 900 mg at bedtime.  [facility] for his multiple sclerosis and trigeminal neuralgia.  He is not sure why he has a diagnosis of trigeminal neuralgia.  He was taken off of 1 of his MS medicines because of the so expensive in his gone on teriflunomide 14 mg.  He needs refills on all his medications unfortunately did not have any labs done in his states he has was seen at [facility] in [facility] after helping his son remodel a house and he got a staph infection according to them on his right hand in mid abdomen was put on Bactroban ointment and clindamycin for which the hand is better but the stomach is still open and draining.  He has gained 4 lb since he was here last and he needs a flu shot today.  He had a aortic aneurysm screen in May that was normal.  He had COVID in March and recovered well.  He states that he has had steroids in the past that helped with his MS in his been on multiple other medications for his MS.  I reviewed with him the results of the office visit from 08/08/2022 for [facility].  Patient denies nausea vomiting diarrhea constipation syncope or near-syncope.  Historical Data    Past Medical History:  Past Medical History:   Diagnosis Date    Arthralgia     Benign prostatic hyperplasia with lower urinary tract symptoms     Calculus of kidney     Chronic bilateral low back pain without sciatica     Corporo-venous occlusive erectile dysfunction     Dejerine  Roussy syndrome     Hyperalgesia     Hyperglycemia     Kidney stones     Male erectile dysfunction, unspecified     Neuromuscular dysfunction of bladder, unspecified     OSA on CPAP     Roussy-Levy syndrome          Past Surgical History:  History reviewed. No past surgical history pertinent negatives.      Allergies:  Allergies   Allergen Reactions    Penicillin Nausea/ Vomiting     Medications:  Current Outpatient Medications   Medication Sig    cholecalciferol, vitamin D3, 50 mcg (2,000 unit) Oral Tablet Take 1 Tablet (2,000 Units total) by mouth Once a day    clindamycin (CLEOCIN) 300 mg Oral Capsule Take 1 Capsule (300 mg total) by mouth Four times a day Indications: skin infection due to Staphylococcus aureus bacteria    fenofibrate micronized (LOFIBRA) 200 mg Oral Capsule Take 1 Capsule (200 mg total) by mouth Once a day    gabapentin (NEURONTIN) 300 mg Oral Capsule Take 1 Capsule (300 mg total) by mouth Three times a day for 90 days    levothyroxine (SYNTHROID) 25 mcg Oral Tablet Take 1 Tablet (25 mcg  total) by mouth Once a day    mupirocin (BACTROBAN) 2 % Ointment Apply topically Three times a day    naproxen (NAPROSYN) 500 mg Oral Tablet Take 1 Tablet (500 mg total) by mouth Twice daily with food Indications: joint damage causing pain and loss of function    tamsulosin (FLOMAX) 0.4 mg Oral Capsule Take 1 Capsule (0.4 mg total) by mouth Once a day for 180 days    teriflunomide 14 mg Oral Tablet Take 1 Tablet (14 mg total) by mouth Once a day Indications: relapsing form of multiple sclerosis     Family History:  Family Medical History:       Problem Relation (Age of Onset)    COPD Other    Hypertension (High Blood Pressure) Other            Social History:  Social History     Socioeconomic History    Marital status: Married   Tobacco Use    Smoking status: Former     Current packs/day: 0.00     Average packs/day: 1 pack/day for 20.0 years (20.0 ttl pk-yrs)     Types: Cigarettes     Start date: 58     Quit  date: 2005     Years since quitting: 19.7    Smokeless tobacco: Never   Vaping Use    Vaping status: Never Used   Substance and Sexual Activity    Alcohol use: Yes     Comment: socially    Drug use: Never           Review of Systems:  Any pertinent Review of Systems as addressed in the HPI above.    Physical Exam:  Vital Signs:  Vitals:    11/22/22 0925   BP: (!) 166/85   Pulse: 84   Resp: 19   Temp: 37 C (98.6 F)   TempSrc: Temporal   SpO2: 95%   Weight: 117 kg (258 lb)   Height: 1.753 m (5\' 9" )   BMI: 38.18     Physical Exam  Vitals and nursing note reviewed.   Constitutional:       General: He is awake.      Appearance: Normal appearance. He is well-developed and well-groomed. He is obese.   HENT:      Head: Normocephalic and atraumatic.      Right Ear: Tympanic membrane, ear canal and external ear normal.      Left Ear: Tympanic membrane, ear canal and external ear normal.      Nose: Nose normal.      Mouth/Throat:      Mouth: Mucous membranes are moist.      Pharynx: Oropharynx is clear.   Eyes:      Extraocular Movements: Extraocular movements intact.      Conjunctiva/sclera: Conjunctivae normal.      Pupils: Pupils are equal, round, and reactive to light.   Cardiovascular:      Rate and Rhythm: Normal rate and regular rhythm.      Pulses: Normal pulses.           Carotid pulses are 2+ on the right side and 2+ on the left side.       Radial pulses are 2+ on the right side and 2+ on the left side.        Dorsalis pedis pulses are 2+ on the right side and 2+ on the left side.        Posterior tibial pulses are 2+  on the right side and 2+ on the left side.      Heart sounds: Normal heart sounds, S1 normal and S2 normal.   Pulmonary:      Effort: Pulmonary effort is normal.      Breath sounds: Normal breath sounds.   Abdominal:      General: Abdomen is protuberant.      Palpations: Abdomen is soft.   Musculoskeletal:         General: Normal range of motion.      Cervical back: Normal range of motion and neck  supple.      Right lower leg: No edema.      Left lower leg: No edema.   Skin:     General: Skin is warm and dry.      Capillary Refill: Capillary refill takes less than 2 seconds.             Comments: Patient has a 1.5 cm round abscess in his mid abdomen.   Neurological:      General: No focal deficit present.      Mental Status: He is alert and oriented to person, place, and time. Mental status is at baseline.      Cranial Nerves: Cranial nerves 2-12 are intact.      Sensory: Sensation is intact.      Motor: Weakness present.      Coordination: Coordination is intact.      Gait: Gait is intact.      Comments: Patient has akathisia   Psychiatric:         Attention and Perception: Attention and perception normal.         Mood and Affect: Affect normal. Mood is anxious.         Speech: Speech normal.         Behavior: Behavior normal. Behavior is cooperative.         Thought Content: Thought content normal.         Cognition and Memory: Cognition and memory normal.         Judgment: Judgment normal.       Assessment:    ICD-10-CM    1. Multiple sclerosis (CMS HCC)  G35       2. Need for vaccination  Z23 Flu Vaccine, 65+,0.5 mL IM (Admin)      3. Colon cancer screening  Z12.11 Cologuard colon cancer screening      4. Essential hypertension  I10 LIPID PANEL     HEPATIC FUNCTION PANEL     CBC/DIFF     URINALYSIS, MACROSCOPIC AND MICROSCOPIC W/CULTURE REFLEX     BASIC METABOLIC PANEL     THYROID STIMULATING HORMONE (SENSITIVE TSH)      5. 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 Vitamin D deficiency  E55.9       8. Trigeminal neuralgia  G50.0       9. Benign prostatic hyperplasia with nocturia  N40.1     R35.1          Plan:  Orders Placed This Encounter    Flu Vaccine, 65+,0.5 mL IM (Admin)    LIPID PANEL    HEPATIC FUNCTION PANEL    CBC/DIFF    URINALYSIS, MACROSCOPIC AND MICROSCOPIC W/CULTURE REFLEX    BASIC METABOLIC PANEL    THYROID STIMULATING HORMONE (SENSITIVE  TSH)    fenofibrate micronized (LOFIBRA) 200 mg  Oral Capsule    gabapentin (NEURONTIN) 300 mg Oral Capsule    levothyroxine (SYNTHROID) 25 mcg Oral Tablet    tamsulosin (FLOMAX) 0.4 mg Oral Capsule    mupirocin (BACTROBAN) 2 % Ointment    clindamycin (CLEOCIN) 300 mg Oral Capsule    teriflunomide 14 mg Oral Tablet    Cologuard colon cancer screening     The patient will get his labs done when he is fasting.  Today I ordered a Cologuard.  Also ordered clindamycin 300 mg 4 times a day Bactroban clear up the rest of the infection on his stomach.  We are going [facility] to see what the culture results were.  I refilled his fenofibrate for mixed hyperlipidemia in his gabapentin for radicular pain from his low back.  Also refilled his levothyroxine 25 for hypothyroidism in his Flomax for BPH.  We gave him a high-dose flu shot today and today for 6 months from now ordered a lipid panel hepatic function panel CBC with diff urinalysis basic metabolic panel thyroid-stimulating hormone.  He should stay on a heart healthy low-fat low-cholesterol diet and avoid high fructose corn syrup and concentrated sweets.  He will continue to follow up with Neurology for his MS.  He will continue vitamin-D supplementation for vitamin-D deficiency Naprosyn for arthritis pain.  I recommend he limit caloric intake to no more than 2000 calories a day and he should stay on a heart healthy low-fat low-cholesterol diet and avoid high fructose corn syrup and concentrated sweets.    Return in about 6 months (around 05/23/2023).    Mickey Farber, DO     Portions of this note may be dictated using voice recognition software or a dictation service. Variances in spelling and vocabulary are possible and unintentional. Not all errors are caught/corrected. Please notify the There[facility] Parkin if any discrepancies are noted or if the meaning of any statement is not clear.

## 2022-11-22 NOTE — Nursing Note (Signed)
11/22/22 0935   Medicare Wellness Assessment   Medicare initial or wellness physical in the last year? No   Advance Directives   Does patient have a living will or MPOA No   Advance directive information given to the patient today? Yes   Activities of Daily Living   Do you need help with dressing, bathing, or walking? No   Do you need help with shopping, housekeeping, medications, or finances? No   Do you have rugs in hallways, broken steps, or poor lighting? No   Do you have grab bars in your bathroom, non-slip strips in your tub, and hand rails on your stairs? Yes   Cognitive Function Screen   What is you age? 1  (77)   What is the time to the nearest hour? 1  (10)   What is the year? 1  (2024)   What is the name of this clinic? 1  [facility])   Can the patient recognize two persons (the doctor, the nurse, home help, etc.)? 1   What is the date of your birth? (day and month sufficient)  1  (02/18/1956)   In what year did World War II end? 0   Who is the current president of the [place]? 1  (BIDEN)   Count from 20 down to 1? 1   What address did I give you earlier? 0  ([address])   Total Score 8   Depression Screen   Little interest or pleasure in doing things. 0   Feeling down, depressed, or hopeless 0   PHQ 2 Total 0   Pain Score   Pain Score Zero   Substance Use Screening   In Past 12 MONTHS, how often have you used any tobacco product (for example, cigarettes, e-cigarettes, cigars, pipes, or smokeless tobacco)? Never   In the PAST 12 MONTHS, how often have you had 5 (men)/4 (women) or more drinks containing alcohol in one day? Never   In the PAST 12 months, how often have you used any prescription medications just for the feeling, more than prescribed, or that were not prescribed for you? Prescriptions may include: opioids, benzodiazepines, medications for ADHD Never   In the PAST 12 MONTHS, how often have you used any drugs, including marijuana, cocaine or crack, heroin, methamphetamine,  hallucinogens, ecstasy/MDMA? Never   Hearing Screen   Have you noticed any hearing difficulties? Yes   After whispering 9-1-6 how many numbers did the patient repeat correctly? 3   After whispering 4-7-8 how many numbers did the patient repeat correctly? 3   Total Correct 6   Fall Risk Assessment   Do you feel unsteady when standing or walking? No   Do you worry about falling? No   Have you fallen in the past year? No   Vision Screen   Right Eye = 20 0.4   Left Eye = 20 0.4   Urinary Incontinence Screen   Do you ever leak urine when you don't want to? No

## 2022-11-22 NOTE — Progress Notes (Signed)
FAMILY MEDICINE, MEDICAL OFFICE BUILDING  [address]  [place] [address]    Medicare Annual Wellness Visit    Name: Carlos Friedman MRN:  [id number]   Date: 11/22/2022 Age: 67 y.o.       SUBJECTIVE:   Carlos Friedman is a 67 y.o. male for presenting for Medicare Wellness exam.   I have reviewed and reconciled the medication list with the patient today.        11/22/2022     9:31 AM   Comprehensive Health Assessment-Adult   Do you wish to complete this form? Yes   During the past 4 weeks, how would you rate your health in general? Very Good   During the past 4 weeks, how much difficulty have you had doing your usual activities inside and outside your home because of medical or emotional problems? A little bit of difficulty   During the past 4 weeks, was someone available to help you if you needed and wanted help? Yes, as much as I wanted   In the past year, how many times have you gone to the emergency department or been admitted to a hospital for a health problem? None   Are you generally satisfied with your sleep? Yes   Do you have enough money to buy things you need in everyday life, such as food, clothing, medicines, and housing? Yes, always   Can you get to places beyond walking distance without help?  (For example, can you drive your own car or travel alone on buses)? Yes   Do you fasten your seatbelt when you are in a car? Yes, usually   Do you exercise 20 minutes 3 or more days per week (such as walking, dancing, biking, mowing grass, swimming)? Yes, most of the time   How often do you eat food that is healthy (fruits, vegetables, lean meats) instead of unhealthy (sweets, fast food, junk food, fatty foods)? Most of the time   Have your parents, brothers or sisters had any of the following problems before the age of 53? (check all that apply) Heart problems, or hardening of the arteries;Diabetes (sugar)   How often do you have trouble taking medicines the eay you are told to take them? I always take  them as prescribed   Do you need any help communicating with your doctors and nurses because of vision or hearing problems? No   During the past 12 months, have you experienced confusion or memory loss that is happening more often or is getting worse? No   Do you have one person you think of as your personal doctor (primary care provider or family doctor)? Yes   If you are seeing a Primary Care Provider (PCP) or family doctor. please list their name Dr. Gaynell Face Dasia Guerrier   Are you now also seeing any specialist physician(s) (such as eye doctor, foot doctor, skin doctor)? Yes   If you are seeing a specialist for anything such as foot, eye, skin, etc.  please list their name(s) Neurology   How confident are you that you can control or manage most of your health problems? Very confident       I have reviewed and updated as appropriate the past medical, family and social history. 11/22/2022 as summarized below:  Past Medical History:   Diagnosis Date    Arthralgia     Benign prostatic hyperplasia with lower urinary tract symptoms     Calculus of kidney     Chronic bilateral low  back pain without sciatica     Corporo-venous occlusive erectile dysfunction     Dejerine Arn Medal syndrome     Hyperalgesia     Hyperglycemia     Kidney stones     Male erectile dysfunction, unspecified     Neuromuscular dysfunction of bladder, unspecified     OSA on CPAP     Roussy-Levy syndrome      Past Surgical History:   Procedure Laterality Date    Excisional hemorrhoidectomy       Current Outpatient Medications   Medication Sig    cholecalciferol, vitamin D3, 50 mcg (2,000 unit) Oral Tablet Take 1 Tablet (2,000 Units total) by mouth Once a day    clindamycin (CLEOCIN) 300 mg Oral Capsule Take 1 Capsule (300 mg total) by mouth Four times a day Indications: skin infection due to Staphylococcus aureus bacteria    fenofibrate micronized (LOFIBRA) 200 mg Oral Capsule Take 1 Capsule (200 mg total) by mouth Once a day    gabapentin (NEURONTIN) 300 mg  Oral Capsule Take 1 Capsule (300 mg total) by mouth Three times a day for 90 days    levothyroxine (SYNTHROID) 25 mcg Oral Tablet Take 1 Tablet (25 mcg total) by mouth Once a day    mupirocin (BACTROBAN) 2 % Ointment Apply topically Three times a day    naproxen (NAPROSYN) 500 mg Oral Tablet Take 1 Tablet (500 mg total) by mouth Twice daily with food Indications: joint damage causing pain and loss of function    tamsulosin (FLOMAX) 0.4 mg Oral Capsule Take 1 Capsule (0.4 mg total) by mouth Once a day for 180 days    teriflunomide 14 mg Oral Tablet Take 1 Tablet (14 mg total) by mouth Once a day Indications: relapsing form of multiple sclerosis     Family Medical History:       Problem Relation (Age of Onset)    COPD Other    Hypertension (High Blood Pressure) Other            Social History     Socioeconomic History    Marital status: Married   Tobacco Use    Smoking status: Former     Current packs/day: 0.00     Average packs/day: 1 pack/day for 20.0 years (20.0 ttl pk-yrs)     Types: Cigarettes     Start date: 68     Quit date: 2005     Years since quitting: 19.7    Smokeless tobacco: Never   Vaping Use    Vaping status: Never Used   Substance and Sexual Activity    Alcohol use: Yes     Comment: socially    Drug use: Never     Social Determinants of Health     Health Literacy: Low Risk  (04/21/2022)    Health Literacy     SDOH Health Literacy: Never         List of Current Health Care Providers   Care Team       PCP       Name Type Specialty Phone Number    Mickey Farber, DO Physician [facility] [phone]              Care Team       No care team found                      Health Maintenance   Topic Date Due    Adult Tdap-Td (1 - Tdap)  Never done    Shingles Vaccine (1 of 2) Never done    Pneumococcal Vaccination, Age 79+ (1 of 1 - PCV) Never done    Colonoscopy  05/23/2023 (Originally 10/09/1998)    Depression Screening  11/22/2023    Medicare Annual Wellness Visit  11/22/2023    AAA Screening  Completed     Influenza Vaccine  Completed    Hepatitis C screening  Discontinued    Covid-19 Vaccine  Discontinued     Medicare Wellness Assessment   Medicare initial or wellness physical in the last year?: No  Advance Directives   Does patient have a living will or MPOA: No           Advance directive information given to the patient today?: Yes      Activities of Daily Living   Do you need help with dressing, bathing, or walking?: No   Do you need help with shopping, housekeeping, medications, or finances?: No   Do you have rugs in hallways, broken steps, or poor lighting?: No   Do you have grab bars in your bathroom, non-slip strips in your tub, and hand rails on your stairs?: Yes   Cognitive Function Screen (1=Yes, 0=No)   What is you age?: Correct (67)   What is the time to the nearest hour?: Correct (10)   What is the year?: Correct (2024)   What is the name of this clinic?: Correct [facility] MEDIC[facility])   Can the patient recognize two persons (the doctor, the nurse, home help, etc.)?: Correct   What is the date of your birth? (day and month sufficient) : Correct (25-Apr-1955)   In what year did World War II end?: Incorrect   Who is the current president of the [place]?: Correct (BIDEN)   Count from 20 down to 1?: Correct   What address did I give you earlier?: Incorrect ([address])   Total Score: 8       Fall Risk Screen   Do you feel unsteady when standing or walking?: No  Do you worry about falling?: No  Have you fallen in the past year?: No   Depression Screen     Little interest or pleasure in doing things.: Not at all  Feeling down, depressed, or hopeless: Not at all  PHQ 2 Total: 0     Pain Score   Pain Score:   0 - No pain    Substance Use-Abuse Screening     Tobacco Use     In Past 12 MONTHS, how often have you used any tobacco product (for example, cigarettes, e-cigarettes, cigars, pipes, or smokeless tobacco)?: Never     Alcohol use     In the PAST 12 MONTHS, how often have you had 5 (men)/4 (women) or more  drinks containing alcohol in one day?: Never     Prescription Drug Use     In the PAST 12 months, how often have you used any prescription medications just for the feeling, more than prescribed, or that were not prescribed for you? Prescriptions may include: opioids, benzodiazepines, medications for ADHD: Never           Illicit Drug Use   In the PAST 12 MONTHS, how often have you used any drugs, including marijuana, cocaine or crack, heroin, methamphetamine, hallucinogens, ecstasy/MDMA?: Never        Hearing Screen   Have you noticed any hearing difficulties?: Yes  After whispering 9-1-6 how many numbers did the patient repeat correctly?:  3  After whispering 4-7-8 how many numbers did the patient repeat correctly?: 3       Vision Screen   Right Eye = 20: 0.4   Left Eye = 20: 0.4     Urine Incontinence Screen   Urinary Incontinence Screen  Do you ever leak urine when you don't want to?: No         OBJECTIVE:   BP (!) 166/85 (Site: Left Arm, Patient Position: Sitting, Cuff Size: Adult)   Pulse 84   Temp 37 C (98.6 F) (Temporal)   Resp 19   Ht 1.753 m (5\' 9" )   Wt 117 kg (258 lb)   SpO2 95%   BMI 38.10 kg/m        Other appropriate exam:    Health Maintenance Due   Topic Date Due    Adult Tdap-Td (1 - Tdap) Never done    Shingles Vaccine (1 of 2) Never done    Pneumococcal Vaccination, Age 51+ (1 of 1 - PCV) Never done      ASSESSMENT & PLAN:  Problem List Items Addressed This Visit    None       Identified Risk Factors/ Recommended Actions       The PHQ 2 Total: 0 depression screen is interpreted as negative.        Advanced Directives: Patient agreeable to - Advanced Directives discussed and patient elected to take home to review.    Today we talked about vaccinations diet exercise weight loss unidentified risk factors for falls and prostate problems as well as screening test for colon cancer and following up with his neurologist.    No orders of the defined types were placed in this encounter.         The  patient has been educated about risk factors and recommended preventive care. Written Prevention Plan completed/ updated and given to patient (see After Visit Summary).    No follow-ups on file.    Mickey Farber, DO

## 2022-11-22 NOTE — Nursing Note (Signed)
11/22/22 0931   Comprehensive Health Assessment-Adult   Do you wish to complete this form? Yes   During the past 4 weeks, how would you rate your health in general? Very Good   During the past 4 weeks, how much difficulty have you had doing your usual activities inside and outside your home because of medical or emotional problems? A little bit of difficulty   During the past 4 weeks, was someone available to help you if you needed and wanted help? Yes, as much as I wanted   In the past year, how many times have you gone to the emergency department or been admitted to a hospital for a health problem? None   Are you generally satisfied with your sleep? Yes   Do you have enough money to buy things you need in everyday life, such as food, clothing, medicines, and housing? Yes, always   Can you get to places beyond walking distance without help?  (For example, can you drive your own car or travel alone on buses)? Yes   Do you fasten your seatbelt when you are in a car? Yes, usually   Do you exercise 20 minutes 3 or more days per week (such as walking, dancing, biking, mowing grass, swimming)? Yes, most of the time   How often do you eat food that is healthy (fruits, vegetables, lean meats) instead of unhealthy (sweets, fast food, junk food, fatty foods)? Most of the time   Have your parents, brothers or sisters had any of the following problems before the age of 76? (check all that apply) Heart problems, or hardening of the arteries;Diabetes (sugar)   How often do you have trouble taking medicines the eay you are told to take them? I always take them as prescribed   Do you need any help communicating with your doctors and nurses because of vision or hearing problems? No   During the past 12 months, have you experienced confusion or memory loss that is happening more often or is getting worse? No   Do you have one person you think of as your personal doctor (primary care provider or family doctor)? Yes   If you are  seeing a Primary Care Provider (PCP) or family doctor. please list their name Dr. Gaynell Face Long   Are you now also seeing any specialist physician(s) (such as eye doctor, foot doctor, skin doctor)? Yes   If you are seeing a specialist for anything such as foot, eye, skin, etc.  please list their name(s) Neurology   How confident are you that you can control or manage most of your health problems? Very confident

## 2023-03-22 ENCOUNTER — Other Ambulatory Visit (INDEPENDENT_AMBULATORY_CARE_PROVIDER_SITE_OTHER): Payer: Self-pay | Admitting: Family Medicine

## 2023-05-23 ENCOUNTER — Other Ambulatory Visit: Payer: Self-pay

## 2023-05-23 ENCOUNTER — Ambulatory Visit (INDEPENDENT_AMBULATORY_CARE_PROVIDER_SITE_OTHER): Payer: Self-pay | Admitting: Family Medicine

## 2023-05-23 ENCOUNTER — Encounter (INDEPENDENT_AMBULATORY_CARE_PROVIDER_SITE_OTHER): Payer: Self-pay | Admitting: Family Medicine

## 2023-05-23 VITALS — BP 187/69 | HR 79 | Temp 98.2°F | Resp 20 | Ht 69.0 in | Wt 226.0 lb

## 2023-05-23 DIAGNOSIS — G35 Multiple sclerosis: Secondary | ICD-10-CM

## 2023-05-23 DIAGNOSIS — I10 Essential (primary) hypertension: Secondary | ICD-10-CM

## 2023-05-23 DIAGNOSIS — E079 Disorder of thyroid, unspecified: Secondary | ICD-10-CM

## 2023-05-23 DIAGNOSIS — R351 Nocturia: Secondary | ICD-10-CM

## 2023-05-23 DIAGNOSIS — G47419 Narcolepsy without cataplexy: Secondary | ICD-10-CM

## 2023-05-23 DIAGNOSIS — M79605 Pain in left leg: Secondary | ICD-10-CM

## 2023-05-23 DIAGNOSIS — E559 Vitamin D deficiency, unspecified: Secondary | ICD-10-CM

## 2023-05-23 DIAGNOSIS — N401 Enlarged prostate with lower urinary tract symptoms: Secondary | ICD-10-CM

## 2023-05-23 DIAGNOSIS — Z87891 Personal history of nicotine dependence: Secondary | ICD-10-CM

## 2023-05-23 DIAGNOSIS — G500 Trigeminal neuralgia: Secondary | ICD-10-CM

## 2023-05-23 DIAGNOSIS — E782 Mixed hyperlipidemia: Secondary | ICD-10-CM

## 2023-05-23 DIAGNOSIS — M79604 Pain in right leg: Secondary | ICD-10-CM

## 2023-05-23 MED ORDER — GABAPENTIN 300 MG CAPSULE
300.0000 mg | ORAL_CAPSULE | Freq: Three times a day (TID) | ORAL | 0 refills | Status: DC
Start: 2023-05-23 — End: 2023-09-12

## 2023-05-23 MED ORDER — LOSARTAN 50 MG TABLET
50.0000 mg | ORAL_TABLET | Freq: Every day | ORAL | 1 refills | Status: DC
Start: 2023-05-23 — End: 2023-09-12
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
## 2023-05-23 NOTE — Progress Notes (Signed)
 FAMILY MEDICINE, MEDICAL OFFICE BUILDING  [address]  [place] [address]       Name: Carlos Friedman MRN:  [id number]   Date: 05/23/2023 Age: 68 y.o.          Provider: Jamison Mccreedy, DO    Reason for visit: Follow Up 6 Months      History of Present Illness:  05/23/2023:  This 68 year old male MS patient states his neurologist has breast cancer and may not seeing new patients the patient's in the future he had a stomach virus 2 or 3 weeks ago not had much of an appetite since adding to his records he has dropped from [id number].  Going to start taking the tamsulosin  every other day.  He gets up in the bathroom 3 or 4 times a night.  Blames his nocturia on his multiple sclerosis.  We discussed vaccinations and I recommended he get the Prevnar 20 RSV shingles and Tdap vaccine.  Unfortunately did not have any labs done he is going to get those done today so we will call him with the any abnormalities.  He denies nausea vomiting diarrhea constipation syncope near-syncope chest pain or shortness for breath.  Historical Data    Past Medical History:  Past Medical History:   Diagnosis Date    Arthralgia     Benign prostatic hyperplasia with lower urinary tract symptoms     Calculus of kidney     Chronic bilateral low back pain without sciatica     Corporo-venous occlusive erectile dysfunction     Dejerine Roussy syndrome     Hyperalgesia     Hyperglycemia     Kidney stones     Male erectile dysfunction, unspecified     Neuromuscular dysfunction of bladder, unspecified     OSA on CPAP     Roussy-Levy syndrome          Past Surgical History:  Past Surgical History:   Procedure Laterality Date    EXCISIONAL HEMORRHOIDECTOMY           Allergies:  Allergies   Allergen Reactions    Penicillins Rash    Penicillin Nausea/ Vomiting     Medications:  Current Outpatient Medications   Medication Sig    cholecalciferol, vitamin D3, 50 mcg (2,000 unit) Oral Tablet Take 1 Tablet (2,000 Units total) by mouth Daily    fenofibrate   micronized (LOFIBRA) 200 mg Oral Capsule Take 1 Capsule (200 mg total) by mouth Once a day    gabapentin  (NEURONTIN ) 300 mg Oral Capsule Take 1 Capsule (300 mg total) by mouth Three times a day for 90 days    levothyroxine  (SYNTHROID) 25 mcg Oral Tablet Take 1 Tablet (25 mcg total) by mouth Once a day    losartan  (COZAAR ) 50 mg Oral Tablet Take 1 Tablet (50 mg total) by mouth Daily Indications: high blood pressure    mupirocin  (BACTROBAN ) 2 % Ointment Apply topically Three times a day    naproxen  (NAPROSYN ) 500 mg Oral Tablet TAKE 1 TABLET (500 MG) BY MOUTH TWICE DAILY WITH FOOD FOR JOINT DAMAGE CAUSING PAIN/LOSS OF FUNCTION    tamsulosin  (FLOMAX ) 0.4 mg Oral Capsule Take 1 Capsule (0.4 mg total) by mouth Once a day for 180 days    teriflunomide  14 mg Oral Tablet Take 1 Tablet (14 mg total) by mouth Once a day Indications: relapsing form of multiple sclerosis     Family History:  Family Medical History:       Problem Relation (Age  of Onset)    COPD Other    Hypertension (High Blood Pressure) Other            Social History:  Social History     Socioeconomic History    Marital status: Married   Tobacco Use    Smoking status: Former     Current packs/day: 0.00     Average packs/day: 1 pack/day for 20.0 years (20.0 ttl pk-yrs)     Types: Cigarettes     Start date: 27     Quit date: 2005     Years since quitting: 20.2    Smokeless tobacco: Never   Vaping Use    Vaping status: Never Used   Substance and Sexual Activity    Alcohol use: Yes     Comment: socially    Drug use: Never           Review of Systems:  Any pertinent Review of Systems as addressed in the HPI above.    Physical Exam:  Vital Signs:  Vitals:    05/23/23 0925   BP: (!) 187/69   Pulse: 79   Resp: 20   Temp: 36.8 C (98.2 F)   TempSrc: Temporal   SpO2: 98%   Weight: 103 kg (226 lb)   Height: 1.753 m (5\' 9" )   BMI: 33.37     Physical Exam  Vitals and nursing note reviewed.   Constitutional:       General: He is awake.      Appearance: Normal appearance.  He is well-developed and well-groomed. He is obese.   HENT:      Head: Normocephalic and atraumatic.      Right Ear: Tympanic membrane, ear canal and external ear normal.      Left Ear: Tympanic membrane, ear canal and external ear normal.      Nose: Nose normal.      Mouth/Throat:      Mouth: Mucous membranes are moist.      Pharynx: Oropharynx is clear.   Eyes:      Extraocular Movements: Extraocular movements intact.      Conjunctiva/sclera: Conjunctivae normal.      Pupils: Pupils are equal, round, and reactive to light.   Cardiovascular:      Rate and Rhythm: Normal rate and regular rhythm.      Pulses: Normal pulses.           Carotid pulses are 2+ on the right side and 2+ on the left side.       Radial pulses are 2+ on the right side and 2+ on the left side.        Dorsalis pedis pulses are 2+ on the right side and 2+ on the left side.        Posterior tibial pulses are 2+ on the right side and 2+ on the left side.      Heart sounds: Normal heart sounds, S1 normal and S2 normal.   Pulmonary:      Effort: Pulmonary effort is normal.      Breath sounds: Normal breath sounds.   Abdominal:      General: Abdomen is protuberant.      Palpations: Abdomen is soft.      Tenderness: There is no abdominal tenderness. There is no right CVA tenderness or left CVA tenderness.   Musculoskeletal:         General: Normal range of motion.      Cervical back: Normal range of motion  and neck supple.      Right lower leg: No edema.      Left lower leg: No edema.   Skin:     General: Skin is warm and dry.      Capillary Refill: Capillary refill takes less than 2 seconds.   Neurological:      General: No focal deficit present.      Mental Status: He is alert and oriented to person, place, and time. Mental status is at baseline.      Cranial Nerves: Cranial nerves 2-12 are intact.      Sensory: Sensation is intact.      Motor: Motor function is intact.      Coordination: Coordination is intact.      Gait: Gait is intact.    Psychiatric:         Mood and Affect: Mood normal.         Behavior: Behavior normal. Behavior is cooperative.       Assessment:    ICD-10-CM    1. Multiple sclerosis (CMS HCC)  G35       2. Essential hypertension  I10 LIPID PANEL     HEPATIC FUNCTION PANEL     CBC/DIFF     URINALYSIS, MACROSCOPIC AND MICROSCOPIC W/CULTURE REFLEX     BASIC METABOLIC PANEL     THYROID  STIMULATING HORMONE (SENSITIVE TSH)      3. 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 Trigeminal neuralgia  G50.0       6. Disorder of thyroid  gland  E07.9       7. Vitamin D deficiency  E55.9       8. Benign prostatic hyperplasia with nocturia  N40.1     R35.1       9. Leg pain, bilateral  M79.604     M79.605          Plan:  Orders Placed This Encounter    LIPID PANEL    HEPATIC FUNCTION PANEL    CBC/DIFF    URINALYSIS, MACROSCOPIC AND MICROSCOPIC W/CULTURE REFLEX    BASIC METABOLIC PANEL    THYROID  STIMULATING HORMONE (SENSITIVE TSH)    losartan  (COZAAR ) 50 mg Oral Tablet     Today I ordered a lipid panel hepatic function panel CBC with diff urinalysis basic metabolic panel thyroid -stimulating hormone.  We are going to refill his gabapentin  for his leg pain from his multiple sclerosis.  Also going to start him on 50 mg of losartan  for his hypertension.  He is going to start taking the Flomax  every other day rather than daily.  We are going to refill his gabapentin  today.  I do recommend he get the RSV vaccine Prevnar 20 shingles and Tdap vaccine.  More than 50% of the visit was spent counseling and coordinating patient care.  All questions were answered to his satisfaction of the patient.  He should continue his excellent weight loss and limit salt to no more than 2 g a day.  I am going to go ahead and start him on a blood pressure medication his systolic blood pressure was 187/69 today.    Return in about 6 months (around 11/22/2023).    Jamison Mccreedy, DO     Portions of this note may be dictated  using voice recognition software or a dictation service. Variances in spelling and vocabulary are possible and unintentional. Not all errors are  caught/corrected. Please notify the [facility] if any discrepancies are noted or if the meaning of any statement is not clear.

## 2023-05-23 NOTE — Nursing Note (Signed)
 05/23/23 4696   Depression Screen   Little interest or pleasure in doing things. 0   Feeling down, depressed, or hopeless 0   PHQ 2 Total 0

## 2023-05-23 NOTE — Nursing Note (Signed)
 05/23/23 4742   Fall Risk Assessment   Do you feel unsteady when standing or walking? No   Do you worry about falling? No   Have you fallen in the past year? No

## 2023-06-26 ENCOUNTER — Other Ambulatory Visit (INDEPENDENT_AMBULATORY_CARE_PROVIDER_SITE_OTHER): Payer: Self-pay | Admitting: Family Medicine

## 2023-07-30 ENCOUNTER — Other Ambulatory Visit (INDEPENDENT_AMBULATORY_CARE_PROVIDER_SITE_OTHER): Payer: Self-pay | Admitting: Family Medicine

## 2023-07-30 ENCOUNTER — Ambulatory Visit: Attending: Family Medicine | Admitting: Family Medicine

## 2023-07-30 ENCOUNTER — Other Ambulatory Visit: Payer: Self-pay

## 2023-07-30 ENCOUNTER — Encounter (INDEPENDENT_AMBULATORY_CARE_PROVIDER_SITE_OTHER): Payer: Self-pay | Admitting: Family Medicine

## 2023-07-30 VITALS — BP 111/59 | HR 100 | Temp 98.5°F | Resp 20 | Ht 69.0 in | Wt 215.0 lb

## 2023-07-30 DIAGNOSIS — R39198 Other difficulties with micturition: Secondary | ICD-10-CM

## 2023-07-30 DIAGNOSIS — K8020 Calculus of gallbladder without cholecystitis without obstruction: Secondary | ICD-10-CM | POA: Insufficient documentation

## 2023-07-30 DIAGNOSIS — R935 Abnormal findings on diagnostic imaging of other abdominal regions, including retroperitoneum: Secondary | ICD-10-CM

## 2023-07-30 DIAGNOSIS — N419 Inflammatory disease of prostate, unspecified: Secondary | ICD-10-CM | POA: Insufficient documentation

## 2023-07-30 DIAGNOSIS — N200 Calculus of kidney: Secondary | ICD-10-CM | POA: Insufficient documentation

## 2023-07-30 DIAGNOSIS — Z87448 Personal history of other diseases of urinary system: Secondary | ICD-10-CM | POA: Insufficient documentation

## 2023-07-30 LAB — POCT URINE DIPSTICK
GLUCOSE: NEGATIVE
KETONE: NEGATIVE
NITRITE: POSITIVE
PH: 5.5
PROTEIN: 30
SPECIFIC GRAVITY: 1.02
UROBILINOGEN: 1

## 2023-07-30 LAB — URINALYSIS, MACROSCOPIC
BILIRUBIN: NEGATIVE mg/dL
BLOOD: NEGATIVE mg/dL
GLUCOSE: NEGATIVE mg/dL
KETONES: NEGATIVE mg/dL
LEUKOCYTES: 500 WBCs/uL — AB
PH: 5.5 (ref 5.0–9.0)
PROTEIN: 20 mg/dL
SPECIFIC GRAVITY: 1.02 (ref 1.002–1.030)
UROBILINOGEN: 2 mg/dL — AB

## 2023-07-30 LAB — URINALYSIS, MICROSCOPIC
RBCS: 3 /HPF (ref <4)
WBCS: 37 /HPF — ABNORMAL HIGH (ref <6)

## 2023-07-30 MED ORDER — CIPROFLOXACIN 500 MG TABLET
500.0000 mg | ORAL_TABLET | Freq: Two times a day (BID) | ORAL | 1 refills | Status: DC
Start: 2023-07-30 — End: 2023-09-12

## 2023-07-30 NOTE — Progress Notes (Signed)
 FAMILY MEDICINE, MEDICAL OFFICE BUILDING  [address]  [place] [address]  Operated by [facility]     Name: Carlos Friedman MRN:  [id number]   Date: 07/30/2023 Age: 68 y.o.          Provider: Jamison Mccreedy, DO    Reason for visit: Urinary Hesitancy, Urinary Frequency, and Nocturia      History of Present Illness:  07/30/2023:  This 68 year old male with a history of multiple sclerosis and previous bladder stone removed by Dr. Arlester Ladd presents with fatigue dry he use and vomiting x2 not feeling well trouble urinating presents with left-sided costovertebral angle pain radiating into left testicle.  States he has been working out in the sun last week build in his fence he had has to get up 60 7 times at night to go the bathroom sleeps around 3 hours if he moves he asked to urinate he has very little urine and hesitancy.  He leaks when he goes back to bed  Historical Data    Past Medical History:  Past Medical History:   Diagnosis Date    Arthralgia     Benign prostatic hyperplasia with lower urinary tract symptoms     Calculus of kidney     Chronic bilateral low back pain without sciatica     Corporo-venous occlusive erectile dysfunction     Dejerine Roussy syndrome     Hyperalgesia     Hyperglycemia     Kidney stones     Male erectile dysfunction, unspecified     Neuromuscular dysfunction of bladder, unspecified     OSA on CPAP     Roussy-Levy syndrome          Past Surgical History:  Past Surgical History:   Procedure Laterality Date    BLADDER STONE REMOVAL      Dr. Asencion Lawless    EXCISIONAL HEMORRHOIDECTOMY           Allergies:  Allergies[1]  Medications:  Current Outpatient Medications   Medication Sig    cholecalciferol, vitamin D3, 50 mcg (2,000 unit) Oral Tablet Take 1 Tablet (2,000 Units total) by mouth Daily    ciprofloxacin HCl (CIPRO) 500 mg Oral Tablet Take 1 Tablet (500 mg total) by mouth Twice daily    fenofibrate  micronized (LOFIBRA) 200 mg Oral Capsule TAKE 1 CAPSULE (200 MG TOTAL) BY  MOUTH ONCE A DAY    gabapentin  (NEURONTIN ) 300 mg Oral Capsule Take 1 Capsule (300 mg total) by mouth Three times a day for 90 days    levothyroxine  (SYNTHROID) 25 mcg Oral Tablet TAKE 1 TABLET (25 MCG TOTAL) BY MOUTH ONCE A DAY    losartan  (COZAAR ) 50 mg Oral Tablet Take 1 Tablet (50 mg total) by mouth Daily Indications: high blood pressure    mupirocin  (BACTROBAN ) 2 % Ointment Apply topically Three times a day    naproxen  (NAPROSYN ) 500 mg Oral Tablet TAKE 1 TABLET (500 MG) BY MOUTH TWICE DAILY WITH FOOD FOR JOINT DAMAGE CAUSING PAIN/LOSS OF FUNCTION    tamsulosin  (FLOMAX ) 0.4 mg Oral Capsule Take 1 Capsule (0.4 mg total) by mouth Once a day for 180 days    teriflunomide  14 mg Oral Tablet Take 1 Tablet (14 mg total) by mouth Once a day Indications: relapsing form of multiple sclerosis     Family History:  Family Medical History:       Problem Relation (Age of Onset)    COPD Other    Hypertension (High Blood Pressure) Other  Social History:  Social History     Socioeconomic History    Marital status: Married   Tobacco Use    Smoking status: Former     Current packs/day: 0.00     Average packs/day: 1 pack/day for 20.0 years (20.0 ttl pk-yrs)     Types: Cigarettes     Start date: 11     Quit date: 2005     Years since quitting: 20.4    Smokeless tobacco: Never   Vaping Use    Vaping status: Never Used   Substance and Sexual Activity    Alcohol use: Yes     Comment: socially    Drug use: Never           Review of Systems:  Any pertinent Review of Systems as addressed in the HPI above.    Physical Exam:  Vital Signs:  Vitals:    07/30/23 1413   BP: (!) 111/59   Pulse: 100   Resp: 20   Temp: 36.9 C (98.5 F)   TempSrc: Temporal   SpO2: 96%   Weight: 97.5 kg (215 lb)   Height: 1.753 m (5\' 9" )   BMI: 31.75     Physical Exam  Vitals and nursing note reviewed.   Constitutional:       General: He is awake.      Appearance: Normal appearance. He is well-developed and well-groomed. He is obese.   HENT:      Head:  Normocephalic and atraumatic.      Right Ear: Tympanic membrane, ear canal and external ear normal.      Left Ear: Tympanic membrane, ear canal and external ear normal.      Nose: Nose normal.      Mouth/Throat:      Mouth: Mucous membranes are moist.      Pharynx: Oropharynx is clear.   Eyes:      Extraocular Movements: Extraocular movements intact.      Conjunctiva/sclera: Conjunctivae normal.      Pupils: Pupils are equal, round, and reactive to light.   Cardiovascular:      Rate and Rhythm: Normal rate and regular rhythm.      Pulses: Normal pulses.           Carotid pulses are 2+ on the right side and 2+ on the left side.       Radial pulses are 2+ on the right side and 2+ on the left side.        Dorsalis pedis pulses are 2+ on the right side and 2+ on the left side.        Posterior tibial pulses are 2+ on the right side and 2+ on the left side.      Heart sounds: Normal heart sounds, S1 normal and S2 normal.   Pulmonary:      Effort: Pulmonary effort is normal.      Breath sounds: Normal breath sounds. No decreased breath sounds, wheezing, rhonchi or rales.   Abdominal:      General: Abdomen is protuberant.      Palpations: Abdomen is soft.      Tenderness: There is abdominal tenderness in the suprapubic area. There is no left CVA tenderness.   Musculoskeletal:         General: Normal range of motion.      Cervical back: Normal range of motion and neck supple.      Lumbar back: Spasms present.      Right lower  leg: No edema.      Left lower leg: No edema.   Skin:     General: Skin is warm and dry.      Capillary Refill: Capillary refill takes less than 2 seconds.   Neurological:      General: No focal deficit present.      Mental Status: He is alert and oriented to person, place, and time. Mental status is at baseline.      Cranial Nerves: Cranial nerves 2-12 are intact.      Sensory: Sensation is intact.      Motor: Motor function is intact.      Coordination: Coordination is intact.      Gait: Gait is  intact.   Psychiatric:         Mood and Affect: Mood normal.         Behavior: Behavior normal. Behavior is cooperative.       Assessment:    ICD-10-CM    1. Prostatitis, unspecified prostatitis type  N41.9 US  KIDNEY WITH BLADDER     URINALYSIS, MACROSCOPIC AND MICROSCOPIC W/CULTURE REFLEX,Clean Catch      2. History of bladder stone  Z87.448 URINALYSIS, MACROSCOPIC AND MICROSCOPIC W/CULTURE REFLEX,Clean Catch     CT RENAL CALCULI SERIES WO IV CONTRAST      3. Nephrolithiasis  N20.0 CT RENAL CALCULI SERIES WO IV CONTRAST         Plan:  Orders Placed This Encounter    US  KIDNEY WITH BLADDER    CT RENAL CALCULI SERIES WO IV CONTRAST    URINALYSIS, MACROSCOPIC AND MICROSCOPIC W/CULTURE REFLEX,Clean Catch    URINALYSIS, MACROSCOPIC    URINALYSIS, MICROSCOPIC    ciprofloxacin HCl (CIPRO) 500 mg Oral Tablet     We are going to try to get a urine today we are going to get a CT of the kidneys and bladder an ultrasound of the kidneys with a postvoid residual and we are going to start him on Cipro and have him increase his Flomax  to 0.4 twice a day.  Depending on the results of the CT scan I encouraged the patient to drink 8 8 oz glasses of water a day.    Return in about 10 days (around 08/09/2023).    Jamison Mccreedy, DO     Portions of this note may be dictated using voice recognition software or a dictation service. Variances in spelling and vocabulary are possible and unintentional. Not all errors are caught/corrected. Please notify the [facility] if any discrepancies are noted or if the meaning of any statement is not clear.        [1]   Allergies  Allergen Reactions    Penicillins Rash    Penicillin Nausea/ Vomiting

## 2023-07-30 NOTE — Addendum Note (Signed)
 Addended by: Hargis Lias on: 07/30/2023 02:40 PM     Modules accepted: Orders
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
## 2023-07-31 ENCOUNTER — Ambulatory Visit (INDEPENDENT_AMBULATORY_CARE_PROVIDER_SITE_OTHER): Payer: Self-pay

## 2023-08-01 LAB — URINE CULTURE,ROUTINE: URINE CULTURE: >100000 — AB

## 2023-08-03 ENCOUNTER — Emergency Department
Admission: EM | Admit: 2023-08-03 | Discharge: 2023-08-03 | Disposition: A | Discharge status: Home / Self Care | Attending: NURSE PRACTITIONER | Admitting: NURSE PRACTITIONER

## 2023-08-03 ENCOUNTER — Encounter (HOSPITAL_BASED_OUTPATIENT_CLINIC_OR_DEPARTMENT_OTHER): Payer: Self-pay

## 2023-08-03 ENCOUNTER — Emergency Department (HOSPITAL_BASED_OUTPATIENT_CLINIC_OR_DEPARTMENT_OTHER)

## 2023-08-03 ENCOUNTER — Other Ambulatory Visit: Payer: Self-pay

## 2023-08-03 DIAGNOSIS — N3090 Cystitis, unspecified without hematuria: Secondary | ICD-10-CM | POA: Insufficient documentation

## 2023-08-03 DIAGNOSIS — N3289 Other specified disorders of bladder: Secondary | ICD-10-CM

## 2023-08-03 DIAGNOSIS — Z87440 Personal history of urinary (tract) infections: Secondary | ICD-10-CM | POA: Insufficient documentation

## 2023-08-03 DIAGNOSIS — N401 Enlarged prostate with lower urinary tract symptoms: Secondary | ICD-10-CM | POA: Insufficient documentation

## 2023-08-03 DIAGNOSIS — D649 Anemia, unspecified: Secondary | ICD-10-CM

## 2023-08-03 DIAGNOSIS — N400 Enlarged prostate without lower urinary tract symptoms: Secondary | ICD-10-CM

## 2023-08-03 LAB — COMPREHENSIVE METABOLIC PANEL, NON-FASTING
ALBUMIN/GLOBULIN RATIO: 0.7 — ABNORMAL LOW (ref 0.8–1.4)
ALBUMIN: 2.9 g/dL — ABNORMAL LOW (ref 3.4–5.0)
ALKALINE PHOSPHATASE: 47 U/L (ref 46–116)
ALT (SGPT): 30 U/L (ref <=78)
ANION GAP: 6 mmol/L (ref 4–13)
AST (SGOT): 34 U/L (ref 15–37)
BILIRUBIN TOTAL: 1 mg/dL (ref 0.2–1.0)
BUN/CREA RATIO: 27
BUN: 31 mg/dL — ABNORMAL HIGH (ref 7–18)
CALCIUM, CORRECTED: 9.8 mg/dL
CALCIUM: 8.9 mg/dL (ref 8.5–10.1)
CHLORIDE: 105 mmol/L (ref 98–107)
CO2 TOTAL: 26 mmol/L (ref 21–32)
CREATININE: 1.13 mg/dL (ref 0.70–1.30)
ESTIMATED GFR: 70 mL/min/{1.73_m2} (ref >59)
GLOBULIN: 4.3
GLUCOSE: 83 mg/dL (ref 74–106)
OSMOLALITY, CALCULATED: 280 mosm/kg (ref 270–290)
POTASSIUM: 4.1 mmol/L (ref 3.5–5.1)
PROTEIN TOTAL: 7.2 g/dL (ref 6.4–8.2)
SODIUM: 137 mmol/L (ref 136–145)

## 2023-08-03 LAB — CBC WITH DIFF
BASOPHIL #: 0.05 10*3/uL (ref 0.00–0.10)
BASOPHIL %: 1 % (ref 0–1)
EOSINOPHIL #: 0.17 10*3/uL (ref 0.00–0.60)
EOSINOPHIL %: 3 % (ref 1–8)
HCT: 34.4 % — ABNORMAL LOW (ref 36.7–47.1)
HGB: 11.8 g/dL — ABNORMAL LOW (ref 12.5–16.3)
LYMPHOCYTE #: 1.4 10*3/uL (ref 1.00–3.00)
LYMPHOCYTE %: 25 % (ref 15–43)
MCH: 28.7 pg (ref 23.8–33.4)
MCHC: 34.2 g/dL (ref 32.5–36.3)
MCV: 84 fL (ref 73.0–96.2)
MONOCYTE #: 0.58 10*3/uL (ref 0.30–1.00)
MONOCYTE %: 10 % (ref 6–14)
MPV: 9.1 fL (ref 7.4–11.4)
NEUTROPHIL #: 3.43 10*3/uL (ref 1.85–7.84)
NEUTROPHIL %: 61 % (ref 44–74)
PLATELETS: 212 10*3/uL (ref 140–440)
RBC: 4.1 10*6/uL (ref 4.06–5.63)
RDW: 18.7 % — ABNORMAL HIGH (ref 12.1–16.2)
WBC: 5.6 10*3/uL (ref 3.6–10.2)

## 2023-08-03 LAB — URINALYSIS, MACRO/MICRO
BILIRUBIN: NEGATIVE mg/dL
BLOOD: NEGATIVE mg/dL
GLUCOSE: NEGATIVE mg/dL
KETONES: NEGATIVE mg/dL
LEUKOCYTES: NEGATIVE WBCs/uL
NITRITE: NEGATIVE
PH: 5.5 (ref 4.6–8.0)
PROTEIN: NEGATIVE mg/dL
SPECIFIC GRAVITY: >=1.03 (ref 1.003–1.035)
UROBILINOGEN: 1 mg/dL (ref 0.2–1.0)

## 2023-08-03 LAB — LIPASE: LIPASE: 25 U/L (ref 16–77)

## 2023-08-03 MED ORDER — IOHEXOL 350 MG IODINE/ML INTRAVENOUS SOLUTION
100.0000 mL | INTRAVENOUS | Status: AC
Start: 2023-08-03 — End: 2023-08-03
  Administered 2023-08-03: 75 mL via INTRAVENOUS

## 2023-08-03 NOTE — Discharge Instructions (Addendum)
 Please follow up with the urologist as soon as possible, I have provided to numbers for you however you are free to follow up with the urologist that is most of your choice    Please continue to take the antibiotics prescribed to you by your primary care    Please call your primary care for a closer follow up to discuss the results of your CT scan today.    Please return to the ER for any fevers, chills, chest pain, shortness of breath increase in pain and discomfort or anything that concerns you.

## 2023-08-03 NOTE — ED Nurses Note (Signed)

## 2023-08-03 NOTE — ED Provider Notes (Signed)
 [facility], [place] - Emergency Department  ED Primary Note  History of Present Illness   Carlos Friedman is a 68 y.o. male who had concerns including Urinary Pain.     68 year old male who comes in complaining of pain with urination increased frequency only able to void a small amount.  Patient states he is following up with his primary care he is currently on Cipro  to treat for urinary tract infection he is scheduled for a CT scan on July 18th but he states that he can not wait any longer and is wanting a CT scan today.    Patient had no other increase in complaints.  He is afebrile nontoxic appearing.  Blood pressure 133/72 heart rate 90 100% on room air respiratory rate 18, afebrile at 96.3      ROS negative other than what is stated in the HPI      Physical Exam   ED Triage Vitals [08/03/23 2041]   BP (Non-Invasive) 133/72   Heart Rate 90   Respiratory Rate 18   Temperature (!) 35.7 C (96.3 F)   SpO2 100 %   Weight 97.5 kg (215 lb)   Height 1.753 m (5' 9)     Physical Exam  Vitals and nursing note reviewed.   Constitutional:       General: He is not in acute distress.     Appearance: Normal appearance. He is normal weight. He is not ill-appearing, toxic-appearing or diaphoretic.   HENT:      Nose: Nose normal.      Mouth/Throat:      Mouth: Mucous membranes are moist.   Eyes:      Extraocular Movements: Extraocular movements intact.      Pupils: Pupils are equal, round, and reactive to light.   Cardiovascular:      Pulses: Normal pulses.      Heart sounds: Normal heart sounds.   Pulmonary:      Effort: Pulmonary effort is normal.      Breath sounds: Normal breath sounds.   Abdominal:      General: Abdomen is flat. Bowel sounds are normal.      Palpations: Abdomen is soft.   Musculoskeletal:         General: Normal range of motion.   Skin:     General: Skin is warm.      Capillary Refill: Capillary refill takes less than 2 seconds.   Neurological:      General: No focal deficit present.       Mental Status: He is alert and oriented to person, place, and time. Mental status is at baseline.   Psychiatric:         Mood and Affect: Mood normal.         Behavior: Behavior normal.       Patient Data   Labs Ordered/Reviewed   COMPREHENSIVE METABOLIC PANEL, NON-FASTING - Abnormal; Notable for the following components:       Result Value    BUN 31 (*)     ALBUMIN 2.9 (*)     ALBUMIN/GLOBULIN RATIO 0.7 (*)     All other components within normal limits    Narrative:     Estimated Glomerular Filtration Rate (eGFR) is calculated using the CKD-EPI (2021) equation, intended for patients 70 years of age and older. If gender is not documented or unknown, there will be no eGFR calculation.   CBC WITH DIFF - Abnormal; Notable for the following components:  HGB 11.8 (*)     HCT 34.4 (*)     RDW 18.7 (*)     All other components within normal limits   LIPASE - Normal   URINALYSIS, MACRO/MICRO - Normal   CBC/DIFF    Narrative:     The following orders were created for panel order CBC/DIFF.  Procedure                               Abnormality         Status                     ---------                               -----------         ------                     CBC WITH YQMV[[id number]]                Abnormal            Final result                 Please view results for these tests on the individual orders.   URINALYSIS WITH REFLEX MICROSCOPIC AND CULTURE IF POSITIVE    Narrative:     The following orders were created for panel order URINALYSIS WITH REFLEX MICROSCOPIC AND CULTURE IF POSITIVE.  Procedure                               Abnormality         Status                     ---------                               -----------         ------                     URINALYSIS, MACRO/MICRO[[id number]]      Normal              Final result                 Please view results for these tests on the individual orders.     CT ABDOMEN PELVIS W IV CONTRAST   Final Result by Edi, Radresults In (06/13 2243)   Nodular  enlargement of the prostate gland with mass effect on the bladder.      Bladder wall thickening, may be related to outlet obstruction and/or cystitis.      1.9 cm right ischial sclerotic lesion. 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               Radiologist location ID: [facility]           Medical Decision Making        Medical Decision Making  Nursing notes reviewed.    Medical chart and diagnostic results reviewed.    Attending available for questioning consult.    Emergency department course:   68 year old male who comes into the emergency room with  a complaint of urinary frequency, urinary pain, decreased amount of urination that has been going on for several weeks.  Patient has been follow up with primary care however he stated he does have a CT scan pending in July however he feels that he does not want to wait and rest requesting a CT scan at this time.  He states that they are there has been no changes with his pain or discomfort    He appears acutely while upon physical exam at the bedside no signs and symptoms of respiratory or cardiovascular distress noted mild left-sided flank pain which patient states has been normal for him over the last several weeks    No significant changes on his CBC or CMP slightly more anemic than he was previous year however patient states he has no bleeding, no significant leukocytosis noted, CT scan shows enlargement of the prostate gland with mass effect on the bladder, bladder wall thickening potentially outlet obstruction or cystitis, lesion that patient has known about since 2019, he was given  The results of a CT scan.    He was instructed that he needs to follow up with Urology.  He states he does not have an upcoming appointment.  I will make some recommendations for him.  Otherwise he does need to follow up with his primary care by calling their office Monday morning.  He verbalizes understanding he  should continue the Cipro .  I have low suspicion for any other pathology at this time.  I do not feel the patient needs further workup.  He feels he is comfortable going home and following up with his primary care.  This time patient is safe and stable for discharge home with strict return instructions      I have discussed and counseled the patient and available family on the patient's condition, test results, treatment, and likely expected clinical course.  Patient was counseled on supportive care at home.  All questions and concerns were addressed at the bedside.  Patient verbalized understanding and is agreeable with the plan.  Patient is always welcome to return to the ER should new or worsening symptoms occur.  At this time patient is safe and stable for discharge home with strict return instructions.    Amount and/or Complexity of Data Reviewed  Labs: ordered.  Radiology: ordered. Decision-making details documented in ED Course.  ECG/medicine tests: independent interpretation performed.      ED Course as of 08/03/23 2310   Fri Aug 03, 2023   2303 CBC/DIFF(!)  No significant leukocytosis noted   2303 HGB(!): 11.8   2303 HCT(!): 34.4   2303 Slightly anemic from a year ago.  No active bleeding   2303 COMPREHENSIVE METABOLIC PANEL, NON-FASTING(!)   2303 SODIUM: 137   2303 CARBON DIOXIDE: 26   2303 ANION GAP: 6   2303 BUN(!): 31   2303 ALBUMIN(!): 2.9  BUN 31, creatinine 1.1, albumin 2.9   2304 LIPASE   2304 LIPASE: 25   2304 URINALYSIS WITH REFLEX MICROSCOPIC AND CULTURE IF POSITIVE  No significant abnormality noted   2304 CT ABDOMEN PELVIS W IV CONTRAST  Nodular enlargement of the prostate gland with mass effect on the bladder.     Bladder wall thickening, may be related to outlet obstruction and/or cystitis.     1.9 cm right ischial sclerotic lesion. 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  Medications Ordered/Administered in the ED    iohexol (OMNIPAQUE 350) infusion (75 mL Intravenous Given 08/03/23 2213)     Clinical Impression   Cystitis (Primary)   Enlarged prostate       Disposition: Discharged

## 2023-08-04 ENCOUNTER — Telehealth (HOSPITAL_COMMUNITY): Payer: Self-pay | Admitting: Family Medicine

## 2023-08-04 NOTE — Progress Notes (Signed)
 Post Ed Follow-Up    Post ED Follow-Up:   Document completed and/or attempted interactive contact(s) after transition to home after emergency department stay.:   Transition Facility and relevant Date:   Discharge Date: 08/03/23  Discharge from [facility] Emergency Department?: Yes  Discharge Facility: [facility]  Contacted by: Coralee Derby, RN  Contact method: Patient/Caregiver Telephone  Contact first attempt: 08/04/2023 12:55 PM  MyChart message sent?: No  Call was answered and then disconnected during NN introduction       Marca Senna, RN

## 2023-08-16 ENCOUNTER — Ambulatory Visit (INDEPENDENT_AMBULATORY_CARE_PROVIDER_SITE_OTHER): Payer: Self-pay | Admitting: Family Medicine

## 2023-08-22 ENCOUNTER — Ambulatory Visit (HOSPITAL_COMMUNITY): Payer: Self-pay

## 2023-09-07 ENCOUNTER — Ambulatory Visit (HOSPITAL_COMMUNITY): Payer: Self-pay

## 2023-09-12 ENCOUNTER — Encounter (INDEPENDENT_AMBULATORY_CARE_PROVIDER_SITE_OTHER): Payer: Self-pay | Admitting: Family Medicine

## 2023-09-12 ENCOUNTER — Ambulatory Visit: Payer: Self-pay | Attending: Family Medicine | Admitting: Family Medicine

## 2023-09-12 ENCOUNTER — Other Ambulatory Visit: Payer: Self-pay

## 2023-09-12 VITALS — BP 152/72 | HR 69 | Temp 97.8°F | Resp 18 | Ht 69.0 in | Wt 217.0 lb

## 2023-09-12 DIAGNOSIS — I10 Essential (primary) hypertension: Secondary | ICD-10-CM | POA: Insufficient documentation

## 2023-09-12 DIAGNOSIS — Z87448 Personal history of other diseases of urinary system: Secondary | ICD-10-CM | POA: Insufficient documentation

## 2023-09-12 DIAGNOSIS — N390 Urinary tract infection, site not specified: Secondary | ICD-10-CM | POA: Insufficient documentation

## 2023-09-12 DIAGNOSIS — K8020 Calculus of gallbladder without cholecystitis without obstruction: Secondary | ICD-10-CM | POA: Insufficient documentation

## 2023-09-12 DIAGNOSIS — G500 Trigeminal neuralgia: Secondary | ICD-10-CM | POA: Insufficient documentation

## 2023-09-12 DIAGNOSIS — R351 Nocturia: Secondary | ICD-10-CM | POA: Insufficient documentation

## 2023-09-12 DIAGNOSIS — E782 Mixed hyperlipidemia: Secondary | ICD-10-CM | POA: Insufficient documentation

## 2023-09-12 DIAGNOSIS — N401 Enlarged prostate with lower urinary tract symptoms: Secondary | ICD-10-CM | POA: Insufficient documentation

## 2023-09-12 DIAGNOSIS — G35 Multiple sclerosis: Secondary | ICD-10-CM | POA: Insufficient documentation

## 2023-09-12 DIAGNOSIS — E039 Hypothyroidism, unspecified: Secondary | ICD-10-CM | POA: Insufficient documentation

## 2023-09-12 DIAGNOSIS — N410 Acute prostatitis: Secondary | ICD-10-CM | POA: Insufficient documentation

## 2023-09-12 LAB — URINALYSIS, MACROSCOPIC
BILIRUBIN: NEGATIVE mg/dL
BLOOD: NEGATIVE mg/dL
GLUCOSE: NEGATIVE mg/dL
KETONES: NEGATIVE mg/dL
LEUKOCYTES: NEGATIVE WBCs/uL
NITRITE: NEGATIVE
PH: 7.5 (ref 5.0–9.0)
PROTEIN: NEGATIVE mg/dL
SPECIFIC GRAVITY: 1.014 (ref 1.002–1.030)
UROBILINOGEN: NORMAL mg/dL

## 2023-09-12 LAB — URINALYSIS, MICROSCOPIC: WBCS: <1 /HPF (ref <6)

## 2023-09-12 MED ORDER — MUPIROCIN 2 % TOPICAL OINTMENT
TOPICAL_OINTMENT | Freq: Three times a day (TID) | CUTANEOUS | 2 refills | Status: AC
Start: 2023-09-12 — End: 2024-03-10

## 2023-09-12 MED ORDER — LEVOTHYROXINE 25 MCG TABLET
25.0000 ug | ORAL_TABLET | Freq: Every day | ORAL | 1 refills | Status: DC
Start: 2023-09-12 — End: 2023-11-26

## 2023-09-12 MED ORDER — LOSARTAN 50 MG TABLET
50.0000 mg | ORAL_TABLET | Freq: Every day | ORAL | 1 refills | Status: DC
Start: 2023-09-12 — End: 2023-11-26

## 2023-09-12 MED ORDER — NAPROXEN 500 MG TABLET
500.0000 mg | ORAL_TABLET | Freq: Three times a day (TID) | ORAL | 1 refills | Status: DC | PRN
Start: 2023-09-12 — End: 2023-11-26

## 2023-09-12 MED ORDER — FENOFIBRATE MICRONIZED 200 MG CAPSULE
200.0000 mg | ORAL_CAPSULE | Freq: Every day | ORAL | 1 refills | Status: DC
Start: 2023-09-12 — End: 2023-11-26

## 2023-09-12 MED ORDER — TAMSULOSIN 0.4 MG CAPSULE
0.4000 mg | ORAL_CAPSULE | Freq: Every day | ORAL | 1 refills | Status: DC
Start: 2023-09-12 — End: 2023-11-26

## 2023-09-12 MED ORDER — GABAPENTIN 300 MG CAPSULE
300.0000 mg | ORAL_CAPSULE | Freq: Three times a day (TID) | ORAL | 0 refills | Status: DC
Start: 2023-09-12 — End: 2023-11-26
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
## 2023-09-12 NOTE — Progress Notes (Signed)
 FAMILY [facility], MEDICAL OFFICE BUILDING  [address]  [place] [address]  Operated by [facility]     Name: Carlos Friedman MRN:  [id number]   Date: 09/12/2023 Age: 68 y.o.          Provider: Layman Law, DO    Reason for visit: Follow-up After Testing      History of Present Illness:  09/12/2023:  This 68 year old male returns for 10 day follow-up after having a CT done over at [facility] that did not show problems with his kidneys but did show a thickened bladder wall and a lesion in his pelvis but I when I asked him if he would get the bone scan that has recommended he declined.  He was seen in the emergency room where it was recommended he follow up with Urology in his he stated he did not have an upcoming appointment he saw the ER doctor back in June 13th.  Historical Data    Past Medical History:  Past Medical History:   Diagnosis Date    Arthralgia     Benign prostatic hyperplasia with lower urinary tract symptoms     Calculus of kidney     Chronic bilateral low back pain without sciatica     Corporo-venous occlusive erectile dysfunction     Dejerine Roussy syndrome     Hyperalgesia     Hyperglycemia     Kidney stones     Male erectile dysfunction, unspecified     Neuromuscular dysfunction of bladder, unspecified     OSA on CPAP     Roussy-Levy syndrome          Past Surgical History:  Past Surgical History:   Procedure Laterality Date    BLADDER STONE REMOVAL      Dr. Janetta    EXCISIONAL HEMORRHOIDECTOMY           Allergies:  Allergies[1]  Medications:  Current Outpatient Medications   Medication Sig    cholecalciferol, vitamin D3, 50 mcg (2,000 unit) Oral Tablet Take 1 Tablet (2,000 Units total) by mouth Daily    fenofibrate  micronized (LOFIBRA) 200 mg Oral Capsule Take 1 Capsule (200 mg total) by mouth Daily    gabapentin  (NEURONTIN ) 300 mg Oral Capsule Take 1 Capsule (300 mg total) by mouth Three times a day for 90 days    levothyroxine  (SYNTHROID) 25 mcg Oral Tablet Take 1  Tablet (25 mcg total) by mouth Daily    losartan  (COZAAR ) 50 mg Oral Tablet Take 1 Tablet (50 mg total) by mouth Daily Indications: high blood pressure    mupirocin  (BACTROBAN ) 2 % Ointment Apply topically Three times a day for 180 days    naproxen  (NAPROSYN ) 500 mg Oral Tablet Take 1 Tablet (500 mg total) by mouth Every 8 hours as needed for Pain for up to 180 days    tamsulosin  (FLOMAX ) 0.4 mg Oral Capsule Take 1 Capsule (0.4 mg total) by mouth Daily for 180 days    teriflunomide  14 mg Oral Tablet Take 1 Tablet (14 mg total) by mouth Once a day Indications: relapsing form of multiple sclerosis     Family History:  Family Medical History:       Problem Relation (Age of Onset)    COPD Other    Hypertension (High Blood Pressure) Other            Social History:  Social History     Socioeconomic History    Marital status: Married   Tobacco Use  Smoking status: Former     Current packs/day: 0.00     Average packs/day: 1 pack/day for 20.0 years (20.0 ttl pk-yrs)     Types: Cigarettes     Start date: 39     Quit date: 2005     Years since quitting: 20.5    Smokeless tobacco: Never   Vaping Use    Vaping status: Never Used   Substance and Sexual Activity    Alcohol use: Yes     Comment: socially    Drug use: Never           Review of Systems:  Any pertinent Review of Systems as addressed in the HPI above.    Physical Exam:  Vital Signs:  Vitals:    09/12/23 1133   BP: (!) 152/72   Pulse: 69   Resp: 18   Temp: 36.6 C (97.8 F)   TempSrc: Temporal   SpO2: 100%   Weight: 98.4 kg (217 lb)   Height: 1.753 m (5' 9)   BMI: 32.05     Physical Exam  Vitals and nursing note reviewed.   Constitutional:       General: He is awake.      Appearance: Normal appearance. He is well-developed and well-groomed. He is obese.   HENT:      Head: Normocephalic and atraumatic.      Right Ear: Tympanic membrane, ear canal and external ear normal.      Left Ear: Tympanic membrane, ear canal and external ear normal.      Nose: Nose normal.       Mouth/Throat:      Mouth: Mucous membranes are moist.      Pharynx: Oropharynx is clear.   Eyes:      Extraocular Movements: Extraocular movements intact.      Conjunctiva/sclera: Conjunctivae normal.      Pupils: Pupils are equal, round, and reactive to light.   Cardiovascular:      Rate and Rhythm: Normal rate and regular rhythm.      Pulses: Normal pulses.           Carotid pulses are 2+ on the right side and 2+ on the left side.       Radial pulses are 2+ on the right side and 2+ on the left side.        Dorsalis pedis pulses are 2+ on the right side and 2+ on the left side.        Posterior tibial pulses are 2+ on the right side and 2+ on the left side.      Heart sounds: Normal heart sounds, S1 normal and S2 normal.   Pulmonary:      Effort: Pulmonary effort is normal.      Breath sounds: Normal breath sounds. No decreased breath sounds, wheezing, rhonchi or rales.   Abdominal:      General: Abdomen is protuberant.      Palpations: Abdomen is soft.      Tenderness: There is no abdominal tenderness. There is no right CVA tenderness or left CVA tenderness.   Musculoskeletal:         General: Normal range of motion.      Cervical back: Normal range of motion and neck supple.      Right lower leg: 1+ Pitting Edema present.      Left lower leg: 1+ Pitting Edema present.   Skin:     General: Skin is warm and dry.  Capillary Refill: Capillary refill takes less than 2 seconds.   Neurological:      General: No focal deficit present.      Mental Status: He is alert and oriented to person, place, and time. Mental status is at baseline.      Cranial Nerves: Cranial nerves 2-12 are intact.      Sensory: Sensation is intact.      Motor: Motor function is intact.      Coordination: Coordination abnormal.      Gait: Gait is intact.   Psychiatric:         Mood and Affect: Mood normal.         Behavior: Behavior normal. Behavior is cooperative.       Assessment:    ICD-10-CM    1. Multiple sclerosis (CMS HCC)  G35        2. Gall bladder stones  K80.20 URINALYSIS, MACROSCOPIC AND MICROSCOPIC W/CULTURE REFLEX,Clean Catch      3. History of bladder stone  Z87.448 URINALYSIS, MACROSCOPIC AND MICROSCOPIC W/CULTURE REFLEX,Clean Catch      4. UTI (urinary tract infection)  N39.0 URINALYSIS, MACROSCOPIC AND MICROSCOPIC W/CULTURE REFLEX,Clean Catch      5. Mixed hyperlipidemia  E78.2       6. Trigeminal neuralgia  G50.0       7. Acquired hypothyroidism  E03.9       8. Benign prostatic hyperplasia with nocturia  N40.1     R35.1       9. Acute prostatitis  N41.0       10. Essential hypertension  I10 CBC/DIFF     BASIC METABOLIC PANEL     URINALYSIS, MACROSCOPIC AND MICROSCOPIC W/CULTURE REFLEX     LIPID PANEL     HEPATIC FUNCTION PANEL     THYROID  STIMULATING HORMONE (SENSITIVE TSH)         Plan:  Orders Placed This Encounter    URINALYSIS, MACROSCOPIC AND MICROSCOPIC W/CULTURE REFLEX,Clean Catch    URINALYSIS, MACROSCOPIC    URINALYSIS, MICROSCOPIC    CBC/DIFF    BASIC METABOLIC PANEL    URINALYSIS, MACROSCOPIC AND MICROSCOPIC W/CULTURE REFLEX    LIPID PANEL    HEPATIC FUNCTION PANEL    THYROID  STIMULATING HORMONE (SENSITIVE TSH)    fenofibrate  micronized (LOFIBRA) 200 mg Oral Capsule    gabapentin  (NEURONTIN ) 300 mg Oral Capsule    losartan  (COZAAR ) 50 mg Oral Tablet    mupirocin  (BACTROBAN ) 2 % Ointment    naproxen  (NAPROSYN ) 500 mg Oral Tablet    levothyroxine  (SYNTHROID) 25 mcg Oral Tablet    tamsulosin  (FLOMAX ) 0.4 mg Oral Capsule     His prostatitis has resolved.  For 2 months from now I have ordered a urinalysis CBC with diff basic metabolic panel lipid panel hepatic function panel thyroid -stimulating hormone.  Today I refilled his Lofibra for his hypertriglyceridemia Flomax  for his BPH and I recommend he see a urologist but he declined I recommend a bone scan but he declined.  I refilled his Neurontin  which helps with his leg pain which he continues to use and benefit.  I refilled his Cozaar  for hypertension and I recommend he limit  salt to no more than 2 g a day.  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  I will see him back in 2 months and he will stay on a heart healthy low-fat low-cholesterol diet  and avoid high fructose corn syrup and concentrated sweets states that he is not going to go back on his multiple sclerosis medication.  I did recommend that he get a Prevnar 20 for pneumonia prevention colonoscopy for colon cancer detection shingles vaccine for shingles and I will see him back and I recommend he limit caloric intake to no more than 2000 calories a day.    Return in about 2 months (around 11/26/2023).    Layman Law, DO     Portions of this note may be dictated using voice recognition software or a dictation service. Variances in spelling and vocabulary are possible and unintentional. Not all errors are caught/corrected. Please notify the dino if any discrepancies are noted or if the meaning of any statement is not clear.        [1]   Allergies  Allergen Reactions    Penicillins Rash    Penicillin Nausea/ Vomiting

## 2023-11-12 ENCOUNTER — Other Ambulatory Visit (INDEPENDENT_AMBULATORY_CARE_PROVIDER_SITE_OTHER): Payer: Self-pay | Admitting: Family Medicine

## 2023-11-12 DIAGNOSIS — Z1211 Encounter for screening for malignant neoplasm of colon: Secondary | ICD-10-CM

## 2023-11-23 ENCOUNTER — Encounter (INDEPENDENT_AMBULATORY_CARE_PROVIDER_SITE_OTHER): Payer: Self-pay | Admitting: Family Medicine

## 2023-11-23 ENCOUNTER — Ambulatory Visit (INDEPENDENT_AMBULATORY_CARE_PROVIDER_SITE_OTHER): Payer: Self-pay

## 2023-11-23 LAB — COLOGUARD® COLON CANCER SCREEN: COLOGUARD RESULT: NEGATIVE

## 2023-11-26 ENCOUNTER — Encounter (INDEPENDENT_AMBULATORY_CARE_PROVIDER_SITE_OTHER): Payer: Self-pay | Admitting: Family Medicine

## 2023-11-26 ENCOUNTER — Other Ambulatory Visit: Payer: Self-pay

## 2023-11-26 ENCOUNTER — Ambulatory Visit: Payer: Self-pay | Attending: Family Medicine | Admitting: Family Medicine

## 2023-11-26 VITALS — BP 132/114 | HR 76 | Temp 97.9°F | Resp 18 | Ht 69.0 in | Wt 206.0 lb

## 2023-11-26 DIAGNOSIS — E039 Hypothyroidism, unspecified: Secondary | ICD-10-CM | POA: Insufficient documentation

## 2023-11-26 DIAGNOSIS — Z87891 Personal history of nicotine dependence: Secondary | ICD-10-CM

## 2023-11-26 DIAGNOSIS — G35D Multiple sclerosis, unspecified: Secondary | ICD-10-CM | POA: Insufficient documentation

## 2023-11-26 DIAGNOSIS — E782 Mixed hyperlipidemia: Secondary | ICD-10-CM | POA: Insufficient documentation

## 2023-11-26 DIAGNOSIS — Z1211 Encounter for screening for malignant neoplasm of colon: Secondary | ICD-10-CM | POA: Insufficient documentation

## 2023-11-26 DIAGNOSIS — G47419 Narcolepsy without cataplexy: Secondary | ICD-10-CM | POA: Insufficient documentation

## 2023-11-26 DIAGNOSIS — I10 Essential (primary) hypertension: Secondary | ICD-10-CM | POA: Insufficient documentation

## 2023-11-26 DIAGNOSIS — Z23 Encounter for immunization: Secondary | ICD-10-CM | POA: Insufficient documentation

## 2023-11-26 DIAGNOSIS — G35 Multiple sclerosis: Secondary | ICD-10-CM

## 2023-11-26 DIAGNOSIS — R351 Nocturia: Secondary | ICD-10-CM | POA: Insufficient documentation

## 2023-11-26 DIAGNOSIS — E559 Vitamin D deficiency, unspecified: Secondary | ICD-10-CM | POA: Insufficient documentation

## 2023-11-26 DIAGNOSIS — G500 Trigeminal neuralgia: Secondary | ICD-10-CM | POA: Insufficient documentation

## 2023-11-26 DIAGNOSIS — Z125 Encounter for screening for malignant neoplasm of prostate: Secondary | ICD-10-CM | POA: Insufficient documentation

## 2023-11-26 DIAGNOSIS — E538 Deficiency of other specified B group vitamins: Secondary | ICD-10-CM | POA: Insufficient documentation

## 2023-11-26 MED ORDER — FENOFIBRATE MICRONIZED 200 MG CAPSULE
200.0000 mg | ORAL_CAPSULE | Freq: Every day | ORAL | 1 refills | Status: AC
Start: 2023-11-26 — End: ?

## 2023-11-26 MED ORDER — TAMSULOSIN 0.4 MG CAPSULE
0.4000 mg | ORAL_CAPSULE | Freq: Every day | ORAL | 1 refills | Status: AC
Start: 2023-11-26 — End: 2024-05-24

## 2023-11-26 MED ORDER — GABAPENTIN 300 MG CAPSULE
300.0000 mg | ORAL_CAPSULE | Freq: Three times a day (TID) | ORAL | 0 refills | Status: AC
Start: 2023-11-26 — End: 2024-02-24

## 2023-11-26 MED ORDER — LOSARTAN 50 MG TABLET
50.0000 mg | ORAL_TABLET | Freq: Every day | ORAL | 1 refills | Status: AC
Start: 2023-11-26 — End: ?

## 2023-11-26 MED ORDER — NAPROXEN 500 MG TABLET
500.0000 mg | ORAL_TABLET | Freq: Three times a day (TID) | ORAL | 1 refills | Status: AC | PRN
Start: 2023-11-26 — End: 2024-05-24

## 2023-11-26 MED ORDER — LEVOTHYROXINE 25 MCG TABLET
25.0000 ug | ORAL_TABLET | Freq: Every day | ORAL | 1 refills | Status: AC
Start: 2023-11-26 — End: ?

## 2023-11-26 MED ORDER — TERIFLUNOMIDE 14 MG TABLET
14.0000 mg | ORAL_TABLET | Freq: Every day | ORAL | 1 refills | Status: AC
Start: 2023-11-26 — End: ?

## 2023-11-26 NOTE — Nursing Note (Signed)
 11/26/23 1120   Depression Screen   Little interest or pleasure in doing things. 0   Feeling down, depressed, or hopeless 0   PHQ 2 Total 0

## 2023-11-26 NOTE — Progress Notes (Signed)
 FAMILY MEDICINE, MEDICAL OFFICE BUILDING  [address]  [place] [address]  Operated by [facility]     Name: Carlos Friedman MRN:  [id number]   Date: 11/26/2023 Age: 68 y.o.          Provider: Layman Law, DO    Reason for visit: Follow Up 3 Months      History of Present Illness:  11/26/2023:  This 68 year old male returns for 3 month follow-up to review his labs and get refills on all his medications.  Unfortunately has not had any labs done.  His blood pressure was elevated in the past but today it is normal today it is 132/84 he had a Cologuard test was negative.  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  His urine still he goes more than 3 times a night but better than he was before when he had a urinary tract infection.  He needs to get his 2nd shingles vaccine and a flu shot today and I recommended he get a Prevnar 20 or 21.  At this time he denies nausea vomiting diarrhea constipation syncope near-syncope chest pain or shortness for breath.  Historical Data    Past Medical History:  Past Medical History:   Diagnosis Date    Arthralgia     Benign prostatic hyperplasia with lower urinary tract symptoms     Calculus of kidney     Chronic bilateral low back pain without sciatica     Corporo-venous occlusive erectile dysfunction     Dejerine Roussy syndrome     Hyperalgesia     Hyperglycemia     Kidney stones     Male erectile dysfunction, unspecified     Neuromuscular dysfunction of bladder, unspecified     OSA on CPAP     Roussy-Levy syndrome          Past Surgical History:  Past Surgical History:   Procedure Laterality Date    BLADDER STONE REMOVAL      Dr. Janetta    EXCISIONAL HEMORRHOIDECTOMY           Allergies:  Allergies[1]  Medications:  Current Outpatient Medications   Medication Sig    cholecalciferol, vitamin D3, 50 mcg (2,000 unit) Oral Tablet Take  1 Tablet (2,000 Units total) by mouth Daily    fenofibrate  micronized (LOFIBRA) 200 mg Oral Capsule Take 1 Capsule (200 mg total) by mouth Daily    gabapentin  (NEURONTIN ) 300 mg Oral Capsule Take 1 Capsule (300 mg total) by mouth Three times a day    levothyroxine  (SYNTHROID) 25 mcg Oral Tablet Take 1 Tablet (25 mcg total) by mouth Daily    losartan  (COZAAR ) 50 mg Oral Tablet Take 1 Tablet (50 mg total) by mouth Daily Indications: high blood pressure    mupirocin  (BACTROBAN ) 2 % Ointment Apply topically Three times a day for 180 days    naproxen  (NAPROSYN ) 500 mg Oral Tablet Take 1 Tablet (500 mg total) by mouth Every 8 hours as needed for Pain for up to 180 days    tamsulosin  (FLOMAX ) 0.4 mg Oral Capsule Take 1 Capsule (0.4 mg total) by mouth Daily    teriflunomide  14 mg Oral Tablet Take 1 Tablet (14 mg total) by mouth Daily Indications: relapsing form of multiple sclerosis     Family History:  Family Medical History:  Problem Relation (Age of Onset)    COPD Other    Hypertension (High Blood Pressure) Other            Social History:  Social History     Socioeconomic History    Marital status: Married   Tobacco Use    Smoking status: Former     Current packs/day: 0.00     Average packs/day: 1 pack/day for 20.0 years (20.0 ttl pk-yrs)     Types: Cigarettes     Start date: 49     Quit date: 2005     Years since quitting: 20.7    Smokeless tobacco: Never   Vaping Use    Vaping status: Never Used   Substance and Sexual Activity    Alcohol use: Yes     Comment: socially    Drug use: Never           Review of Systems:  Any pertinent Review of Systems as addressed in the HPI above.    Physical Exam:  Vital Signs:  Vitals:    11/26/23 1120   BP: (!) 132/114   Pulse: 76   Resp: 18   Temp: 36.6 C (97.9 F)   TempSrc: Temporal   SpO2: 98%   Weight: 93.4 kg (206 lb)   Height: 1.753 m (5' 9)   BMI: 30.42     Physical Exam  Vitals and nursing note reviewed.   Constitutional:       General: He is awake.      Appearance:  Normal appearance. He is well-developed, well-groomed and overweight.   HENT:      Head: Normocephalic and atraumatic.      Right Ear: Tympanic membrane, ear canal and external ear normal.      Left Ear: Tympanic membrane, ear canal and external ear normal.      Nose: Nose normal.      Mouth/Throat:      Mouth: Mucous membranes are moist.      Pharynx: Oropharynx is clear.   Eyes:      Extraocular Movements: Extraocular movements intact.      Conjunctiva/sclera: Conjunctivae normal.      Pupils: Pupils are equal, round, and reactive to light.   Cardiovascular:      Rate and Rhythm: Normal rate and regular rhythm.      Pulses: Normal pulses.           Carotid pulses are 2+ on the right side and 2+ on the left side.       Radial pulses are 2+ on the right side and 2+ on the left side.        Dorsalis pedis pulses are 2+ on the right side and 2+ on the left side.        Posterior tibial pulses are 2+ on the right side and 2+ on the left side.      Heart sounds: Normal heart sounds, S1 normal and S2 normal.   Pulmonary:      Effort: Pulmonary effort is normal.      Breath sounds: No decreased breath sounds, wheezing, rhonchi or rales.   Abdominal:      General: Abdomen is protuberant.      Palpations: Abdomen is soft.      Tenderness: There is no abdominal tenderness. There is no right CVA tenderness or left CVA tenderness.   Musculoskeletal:         General: Normal range of motion.  Cervical back: Normal range of motion and neck supple.      Right lower leg: No edema.      Left lower leg: No edema.   Skin:     General: Skin is warm and dry.      Capillary Refill: Capillary refill takes less than 2 seconds.   Neurological:      General: No focal deficit present.      Mental Status: He is alert and oriented to person, place, and time. Mental status is at baseline.      Cranial Nerves: Cranial nerves 2-12 are intact.      Sensory: Sensory deficit present.      Motor: Tremor present.      Coordination: Coordination  abnormal.      Gait: Gait is intact.   Psychiatric:         Mood and Affect: Mood normal.         Behavior: Behavior normal. Behavior is cooperative.       Assessment:    ICD-10-CM    1. Essential hypertension  I10 CBC/DIFF     BASIC METABOLIC PANEL     URINALYSIS, MACROSCOPIC AND MICROSCOPIC W/CULTURE REFLEX     LIPID PANEL     HEPATIC FUNCTION PANEL     THYROID  STIMULATING HORMONE (SENSITIVE TSH)      2. Need for immunization against influenza  Z23 Flu Vaccine, 65+,0.5 mL IM (Admin)      3. Mixed hyperlipidemia  E78.2       4. 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 Acquired hypothyroidism  E03.9       7. Screening for colon cancer  Z12.11 CANCELED: Cologuard colon cancer screening      8. Trigeminal neuralgia  G50.0       9. Vitamin D deficiency  E55.9       10. Nocturia more than twice per night  R35.1 Referral to UROLOGY -  - [facility]         Plan:  Orders Placed This Encounter    Flu Vaccine, 65+,0.5 mL IM (Admin)    CBC/DIFF    BASIC METABOLIC PANEL    URINALYSIS, MACROSCOPIC AND MICROSCOPIC W/CULTURE REFLEX    LIPID PANEL    HEPATIC FUNCTION PANEL    THYROID  STIMULATING HORMONE (SENSITIVE TSH)    Referral to NEUROLOGY - [place] - [facility]    Referral to UROLOGY - [place] - [facility]    gabapentin  (NEURONTIN ) 300 mg Oral Capsule    fenofibrate  micronized (LOFIBRA) 200 mg Oral Capsule    levothyroxine  (SYNTHROID) 25 mcg Oral Tablet    losartan  (COZAAR ) 50 mg Oral Tablet    naproxen  (NAPROSYN ) 500 mg Oral Tablet    tamsulosin  (FLOMAX ) 0.4 mg Oral Capsule    teriflunomide  14 mg Oral Tablet    CANCELED: Cologuard colon cancer screening     Today I gave the patient a flu shot.  For 3 months from now I have ordered a CBC with diff basic metabolic panel urinalysis lipid panel hepatic function panel thyroid -stimulating hormone vitamin-D and B12 level.  I am going to refer the patient to Dr. Sherre for his  multiple sclerosis and neuropathy and for his urological problems so we are going to refer him to [place] 8 air until we get a urologist.  Today I refilled his gabapentin  for neuropathy and trigeminal  neuralgia.  For his mixed hyperlipidemia I refilled his Lerry and for his hypothyroidism I refilled his levothyroxine  for his hypertension I refilled his Cozaar  50 and I recommend he limit salt to no more than 2 g a day in his see should stay on a heart healthy low-fat low-cholesterol diet.  For his arthritis pain I refilled his Naprosyn  and I recommend he take it with food for his BPH I refilled his Flomax  and for his multiple sclerosis I refilled his teriflunomide .  More than 50% of the visit was spent counseling and coordinating patient care.  All questions were answered to his satisfaction of the patient.    No follow-ups on file.    Layman Law, DO     Portions of this note may be dictated using voice recognition software or a dictation service. Variances in spelling and vocabulary are possible and unintentional. Not all errors are caught/corrected. Please notify the dino if any discrepancies are noted or if the meaning of any statement is not clear.        [1]   Allergies  Allergen Reactions    Penicillins Rash    Penicillin Nausea/ Vomiting
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
## 2023-12-03 ENCOUNTER — Ambulatory Visit (INDEPENDENT_AMBULATORY_CARE_PROVIDER_SITE_OTHER): Payer: Self-pay | Admitting: Family Medicine

## 2024-03-06 ENCOUNTER — Encounter (INDEPENDENT_AMBULATORY_CARE_PROVIDER_SITE_OTHER): Payer: Self-pay

## 2024-03-06 ENCOUNTER — Other Ambulatory Visit: Payer: Self-pay

## 2024-03-06 ENCOUNTER — Ambulatory Visit (INDEPENDENT_AMBULATORY_CARE_PROVIDER_SITE_OTHER): Payer: Self-pay

## 2024-03-06 VITALS — BP 176/96 | HR 92 | Ht 69.0 in | Wt 195.6 lb

## 2024-03-06 DIAGNOSIS — N3281 Overactive bladder: Secondary | ICD-10-CM

## 2024-03-06 DIAGNOSIS — N400 Enlarged prostate without lower urinary tract symptoms: Secondary | ICD-10-CM

## 2024-03-06 DIAGNOSIS — N3941 Urge incontinence: Secondary | ICD-10-CM

## 2024-03-06 DIAGNOSIS — R351 Nocturia: Secondary | ICD-10-CM

## 2024-03-06 DIAGNOSIS — R350 Frequency of micturition: Secondary | ICD-10-CM

## 2024-03-06 DIAGNOSIS — N401 Enlarged prostate with lower urinary tract symptoms: Secondary | ICD-10-CM

## 2024-03-06 MED ORDER — MIRABEGRON ER 50 MG TABLET,EXTENDED RELEASE 24 HR: 50.0000 mg | ORAL_TABLET | Freq: Every day | ORAL | 3 refills | Status: AC

## 2024-03-06 MED ORDER — LIDOCAINE 2 % MUCOSAL JELLY IN APPLICATOR: Status: AC

## 2024-03-06 NOTE — Progress Notes (Signed)
 Cystoscopy Procedure:   Description of Procedure:  Informed verbal and written consent was obtained.  The patient was positioned supine on the examination table. The patients urethral meatus and perimeatal tissues were prepped with Betadine solution and Uro-jet 2% lidocaine  jelly was utilized as topical anesthesia.  The cystoscope was then inserted per meatus under direct visualization.     Findings:  Urethra: normal, no masses, no urethral calculi or stricture  Prostate: visual obstruction secondary to lateral lobe hyperplasia- severe without pathologic elongation of the prostatic urethra or large median lobe ball-valve effect  Bladder: no suspicious bladder masses, erythema or calculi present. ureteral orifices are orthotopic and clear yellow efflux from each was noted, moderate trabeculations.   External genitalia: normal, no masses, vulvovaginal atrophy present    Post-Cystoscopy Instructions:   1. Peri-procedural oral antibiotic prescribed and to be taken by the patient.  2. Patient to void prior to leaving the office.  3. Patient was instructed to telephone the office or the after hours exchange with any questions or concerns.

## 2024-03-06 NOTE — Progress Notes (Signed)
 UROLOGY, [facility]  [address] [place] [address]    UROLOGY OFFICE NOTE    Name: Carlos Friedman MRN:  [id number]   Date: 03/06/2024 DOB:  Dec 14, 1955 (69 y.o.)        HPI  History of Present Illness    69 years old male with MS and urinary frequency and nocturia by 5, on Flomax .  Urge incontinence. Flow OK    Not sexually active    Past Medical History:   Diagnosis Date    Arthralgia     Benign prostatic hyperplasia with lower urinary tract symptoms     Calculus of kidney     Chronic bilateral low back pain without sciatica     Corporo-venous occlusive erectile dysfunction     Dejerine Roussy syndrome     Hyperalgesia     Hyperglycemia     Kidney stones     Male erectile dysfunction, unspecified     Neuromuscular dysfunction of bladder, unspecified     OSA on CPAP     Roussy-Levy syndrome       Past Surgical History:   Procedure Laterality Date    BLADDER STONE REMOVAL      Dr. Janetta    EXCISIONAL HEMORRHOIDECTOMY            Current Outpatient Medications   Medication Sig    cholecalciferol, vitamin D3, 50 mcg (2,000 unit) Oral Tablet Take 1 Tablet (2,000 Units total) by mouth Daily    fenofibrate  micronized (LOFIBRA) 200 mg Oral Capsule Take 1 Capsule (200 mg total) by mouth Daily    gabapentin  (NEURONTIN ) 300 mg Oral Capsule Take 1 Capsule (300 mg total) by mouth Three times a day    levothyroxine  (SYNTHROID) 25 mcg Oral Tablet Take 1 Tablet (25 mcg total) by mouth Daily    losartan  (COZAAR ) 50 mg Oral Tablet Take 1 Tablet (50 mg total) by mouth Daily Indications: high blood pressure    mupirocin  (BACTROBAN ) 2 % Ointment Apply topically Three times a day for 180 days    naproxen  (NAPROSYN ) 500 mg Oral Tablet Take 1 Tablet (500 mg total) by mouth Every 8 hours as needed for Pain for up to 180 days    tamsulosin  (FLOMAX ) 0.4 mg Oral Capsule Take 1 Capsule (0.4 mg total) by mouth Daily    teriflunomide  14 mg Oral Tablet Take 1 Tablet (14 mg total) by mouth Daily Indications: relapsing  form of multiple sclerosis        ROS   12 point review of systems completed and negative except as stated above.    PHYSICAL EXAM  Gen  Constitutional: no acute distress and cooperative  HEENT  Head: Present atraumatic and normocephalic  Eye: Present EOMI and PERRL  Neck  Neck: Present normal inspection; Absent lymphadenopathy or tenderness  Cardiovascular  no digital clubbing or cyanosis present, no palpable abdominal aortic aneurysm. No peripheral edema.  Lungs  Respiratory: Absent accessory muscle use or respiratory distress  Abd  Abdominal: Present soft; Absent tenderness, distended or rebound  Comments:   surgical scars consistent with surgical history  Bladder:  Nontender to palpation, not palpably distended   Rectal  Anus:  No inflammation or lesions present  Digital rectal examination:    Tone and masses:  Normal sphincter tone, no rectal masses present, no hemorrhoids present     Prostate:  30 g, smooth, nontender to palpation, no nodules present, consistency benign   Perineum:  Perineum normal  Ext  Comments:   moving all extremities, no rashes or lesions present  Skin  Skin: Present intact and normal color; Absent cyanosis or rash  Neuro  Neurological: Present alert, oriented X3 and moving all extremities  Psych  Psychiatric: Present normal affect and normal thought process    PLAN    ICD-10-CM    1. Benign prostatic hyperplasia, unspecified whether lower urinary tract symptoms present  N40.0 PSA, DIAGNOSTIC      2. Nocturia more than twice per night  R35.1          BPH with nocturia   Ms  Cysto today- lateral lobe hypertrophy  Will schewdule urolift  Went over side effects and alternative  Also start myrbetriq  for OAB      No follow-ups on file.     Orders Placed This Encounter    PSA, DIAGNOSTIC      Marylouise Ro MD

## 2024-03-18 ENCOUNTER — Other Ambulatory Visit (INDEPENDENT_AMBULATORY_CARE_PROVIDER_SITE_OTHER): Payer: Self-pay

## 2024-03-18 DIAGNOSIS — Z01818 Encounter for other preprocedural examination: Secondary | ICD-10-CM

## 2024-03-24 ENCOUNTER — Other Ambulatory Visit: Payer: Self-pay

## 2024-03-24 ENCOUNTER — Ambulatory Visit: Admission: RE | Admit: 2024-03-24 | Discharge: 2024-03-24 | Disposition: A | Source: Ambulatory Visit

## 2024-03-24 ENCOUNTER — Ambulatory Visit (HOSPITAL_COMMUNITY)

## 2024-03-24 DIAGNOSIS — Z01818 Encounter for other preprocedural examination: Secondary | ICD-10-CM | POA: Insufficient documentation

## 2024-03-24 LAB — BASIC METABOLIC PANEL
ANION GAP: 5 mmol/L (ref 4–13)
BUN/CREA RATIO: 22 (ref 6–22)
BUN: 20 mg/dL (ref 7–25)
CALCIUM: 8.7 mg/dL (ref 8.6–10.3)
CHLORIDE: 109 mmol/L — ABNORMAL HIGH (ref 98–107)
CO2 TOTAL: 25 mmol/L (ref 21–31)
CREATININE: 0.91 mg/dL (ref 0.60–1.30)
ESTIMATED GFR: 91 mL/min/{1.73_m2} (ref >59)
GLUCOSE: 77 mg/dL (ref 74–109)
OSMOLALITY, CALCULATED: 279 mosm/kg (ref 270–290)
POTASSIUM: 4.1 mmol/L (ref 3.5–5.1)
SODIUM: 139 mmol/L (ref 136–145)

## 2024-03-24 LAB — CBC
HCT: 34.4 % — ABNORMAL LOW (ref 36.7–47.1)
HGB: 11.5 g/dL — ABNORMAL LOW (ref 12.5–16.3)
MCH: 27.1 pg (ref 23.8–33.4)
MCHC: 33.5 g/dL (ref 32.5–36.3)
MCV: 80.9 fL (ref 73.0–96.2)
MPV: 9.6 fL (ref 7.4–11.4)
PLATELETS: 151 10*3/uL (ref 140–440)
RBC: 4.25 10*6/uL (ref 4.06–5.63)
RDW: 17.2 % — ABNORMAL HIGH (ref 12.1–16.2)
WBC: 4 10*3/uL (ref 3.6–10.2)

## 2024-03-24 LAB — URINALYSIS, MICROSCOPIC

## 2024-03-24 LAB — URINALYSIS, MACROSCOPIC
BILIRUBIN: NEGATIVE mg/dL
BLOOD: NEGATIVE mg/dL
GLUCOSE: NEGATIVE mg/dL
KETONES: NEGATIVE mg/dL
LEUKOCYTE ESTERASE: NEGATIVE WBCs/uL
NITRITE: NEGATIVE
PH: 6.5 (ref 5.0–8.0)
PROTEIN: NEGATIVE mg/dL
SPECIFIC GRAVITY: 1.009 (ref 1.005–1.030)
UROBILINOGEN: NEGATIVE mg/dL

## 2024-03-25 DIAGNOSIS — R9431 Abnormal electrocardiogram [ECG] [EKG]: Secondary | ICD-10-CM

## 2024-03-25 DIAGNOSIS — I491 Atrial premature depolarization: Secondary | ICD-10-CM

## 2024-03-25 DIAGNOSIS — Z01810 Encounter for preprocedural cardiovascular examination: Secondary | ICD-10-CM

## 2024-03-25 LAB — ECG 12 LEAD
Atrial Rate: 72 {beats}/min
Calculated R Axis: 47 degrees
Calculated T Axis: -31 degrees
PR Interval: 176 ms
QRS Duration: 90 ms
QT Interval: 374 ms
QTC Calculation: 409 ms
Ventricular rate: 72 {beats}/min

## 2024-04-02 ENCOUNTER — Ambulatory Visit: Admission: RE | Admit: 2024-04-02 | Source: Ambulatory Visit

## 2024-04-02 ENCOUNTER — Encounter (HOSPITAL_COMMUNITY): Admission: RE | Payer: Self-pay | Source: Ambulatory Visit

## 2024-05-29 ENCOUNTER — Ambulatory Visit (INDEPENDENT_AMBULATORY_CARE_PROVIDER_SITE_OTHER): Payer: Self-pay | Admitting: Family Medicine
